# Patient Record
Sex: Female | Born: 1970 | Race: White | Hispanic: No | Marital: Married | Smoking: Former smoker
Health system: Southern US, Community
[De-identification: ages and names within clinical notes are randomized; demographics above are authoritative.]

## PROBLEM LIST (undated history)

## (undated) ENCOUNTER — Ambulatory Visit: Admission: EM | Payer: BC Managed Care – PPO

## (undated) DIAGNOSIS — E079 Disorder of thyroid, unspecified: Secondary | ICD-10-CM

## (undated) HISTORY — DX: Disorder of thyroid, unspecified: E07.9

## (undated) HISTORY — PX: TUBAL LIGATION: SHX77

---

## 2004-09-28 ENCOUNTER — Ambulatory Visit: Payer: Self-pay | Admitting: Obstetrics and Gynecology

## 2011-01-06 ENCOUNTER — Emergency Department: Payer: Self-pay | Admitting: Emergency Medicine

## 2011-11-05 ENCOUNTER — Emergency Department: Payer: Self-pay | Admitting: Emergency Medicine

## 2011-11-05 LAB — CBC
HCT: 39.2 % (ref 35.0–47.0)
HGB: 13.8 g/dL (ref 12.0–16.0)
MCH: 35.3 pg — ABNORMAL HIGH (ref 26.0–34.0)
MCV: 100 fL (ref 80–100)
Platelet: 252 10*3/uL (ref 150–440)
RBC: 3.9 10*6/uL (ref 3.80–5.20)
RDW: 12.2 % (ref 11.5–14.5)

## 2011-11-05 LAB — BASIC METABOLIC PANEL
Anion Gap: 8 (ref 7–16)
BUN: 10 mg/dL (ref 7–18)
Chloride: 106 mmol/L (ref 98–107)
Co2: 25 mmol/L (ref 21–32)
Creatinine: 0.84 mg/dL (ref 0.60–1.30)
Glucose: 74 mg/dL (ref 65–99)
Potassium: 3.5 mmol/L (ref 3.5–5.1)
Sodium: 139 mmol/L (ref 136–145)

## 2011-11-06 ENCOUNTER — Emergency Department: Payer: Self-pay | Admitting: *Deleted

## 2011-11-07 ENCOUNTER — Ambulatory Visit: Payer: Self-pay | Admitting: *Deleted

## 2012-06-07 ENCOUNTER — Emergency Department: Payer: Self-pay | Admitting: Internal Medicine

## 2012-10-24 ENCOUNTER — Emergency Department: Payer: Self-pay | Admitting: Emergency Medicine

## 2013-12-23 ENCOUNTER — Emergency Department: Payer: Self-pay | Admitting: Emergency Medicine

## 2014-06-16 LAB — HM PAP SMEAR: HM Pap smear: NEGATIVE

## 2014-08-03 HISTORY — PX: ROTATOR CUFF REPAIR: SHX139

## 2014-10-20 ENCOUNTER — Ambulatory Visit: Payer: Self-pay | Admitting: Family Medicine

## 2014-12-29 ENCOUNTER — Other Ambulatory Visit: Payer: Self-pay | Admitting: Family Medicine

## 2014-12-29 NOTE — Telephone Encounter (Signed)
Oct 11, 2014 note reviewed I do not see that she ever returned for her follow-up labs She did not keep last visit with Dr. Laural Benes I will allow just enough medicine to last until Dr. Laural Benes returns and she can decide next step (ordering labs or asking patient to be seen for appt)

## 2015-01-02 NOTE — Telephone Encounter (Signed)
Patient needs to come in for lab work

## 2015-02-07 ENCOUNTER — Ambulatory Visit (INDEPENDENT_AMBULATORY_CARE_PROVIDER_SITE_OTHER): Payer: BLUE CROSS/BLUE SHIELD | Admitting: Family Medicine

## 2015-02-07 ENCOUNTER — Encounter: Payer: Self-pay | Admitting: Family Medicine

## 2015-02-07 VITALS — BP 105/69 | HR 60 | Temp 97.9°F | Ht 61.4 in | Wt 121.0 lb

## 2015-02-07 DIAGNOSIS — E039 Hypothyroidism, unspecified: Secondary | ICD-10-CM | POA: Diagnosis not present

## 2015-02-07 MED ORDER — LEVOTHYROXINE SODIUM 100 MCG PO TABS: 100.0000 ug | ORAL_TABLET | Freq: Every day | ORAL | Status: DC

## 2015-02-07 NOTE — Assessment & Plan Note (Signed)
Likely to be very out of control as has not been taking medicine for a month. Will refill and will recheck with lab visit in 6-8 weeks and adjust as needed. Strongly encouraged patient to take her medicine.

## 2015-02-07 NOTE — Progress Notes (Signed)
BP 105/69 mmHg  Pulse 60  Temp(Src) 97.9 F (36.6 C)  Ht 5' 1.4" (1.56 m)  Wt 121 lb (54.885 kg)  BMI 22.55 kg/m2  SpO2 98%  LMP 09/13/2014 (Exact Date)   Subjective:    Patient ID: Vicki Schultz, female    DOB: 1970-07-15, 44 y.o.   MRN: [id number]  HPI: Vicki Schultz is a 44 y.o. female  Chief Complaint  Patient presents with  . Hypothyroidism   HYPOTHYROIDISM- has not been taking her medicine for about the past month Thyroid control status:uncontrolled Satisfied with current treatment? no Medication side effects: yes Medication compliance: poor compliance Recent dose adjustment:no Fatigue: yes Cold intolerance: no Heat intolerance: no Weight gain: no Weight loss: no Constipation: no Diarrhea/loose stools: yes Palpitations: no Lower extremity edema: no Anxiety/depressed mood: no  Relevant past medical, surgical, family and social history reviewed and updated as indicated. Interim medical history since our last visit reviewed. Allergies and medications reviewed and updated.  Review of Systems  Constitutional: Negative.   Respiratory: Negative.   Cardiovascular: Negative.   Psychiatric/Behavioral: Negative.     Per HPI unless specifically indicated above     Objective:    BP 105/69 mmHg  Pulse 60  Temp(Src) 97.9 F (36.6 C)  Ht 5' 1.4" (1.56 m)  Wt 121 lb (54.885 kg)  BMI 22.55 kg/m2  SpO2 98%  LMP 09/13/2014 (Exact Date)  Wt Readings from Last 3 Encounters:  02/07/15 121 lb (54.885 kg)  06/16/14 120 lb (54.432 kg)    Physical Exam  Constitutional: She is oriented to person, place, and time. She appears well-developed and well-nourished. No distress.  HENT:  Head: Normocephalic and atraumatic.  Right Ear: Hearing normal.  Left Ear: Hearing normal.  Nose: Nose normal.  Eyes: Conjunctivae, EOM and lids are normal. Pupils are equal, round, and reactive to light. Right eye exhibits no discharge. Left eye exhibits no discharge. No  scleral icterus.  Cardiovascular: Normal rate, regular rhythm, normal heart sounds and intact distal pulses.  Exam reveals no gallop and no friction rub.   No murmur heard. Pulmonary/Chest: Effort normal and breath sounds normal. No respiratory distress. She has no wheezes. She has no rales. She exhibits no tenderness.  Musculoskeletal: Normal range of motion.  Neurological: She is alert and oriented to person, place, and time.  Skin: Skin is warm, dry and intact. No rash noted. She is not diaphoretic. No erythema. No pallor.  Psychiatric: She has a normal mood and affect. Her speech is normal and behavior is normal. Judgment and thought content normal. Cognition and memory are normal.  Nursing note and vitals reviewed.   Results for orders placed or performed in visit on 11/05/11  CBC  Result Value Ref Range   WBC 10.6 3.6-11.0 x10 3/mm 3   RBC 3.90 3.80-5.20 X10 6/mm 3   HGB 13.8 12.0-16.0 g/dL   HCT 39.2 35.0-47.0 %   MCV 100 80-100 fL   MCH 35.3 (H) 26.0-34.0 pg   MCHC 35.1 32.0-36.0 g/dL   RDW 12.2 11.5-14.5 %   Platelet 252 150-440 x10 3/mm 3  Basic metabolic panel  Result Value Ref Range   Glucose 74 65-99 mg/dL   BUN 10 7-18 mg/dL   Creatinine 0.84 0.60-1.30 mg/dL   Sodium 139 136-145 mmol/L   Potassium 3.5 3.5-5.1 mmol/L   Chloride 106 98-107 mmol/L   Co2 25 21-32 mmol/L   Calcium, Total 8.3 (L) 8.5-10.1 mg/dL   Osmolality 275 275-301   Anion Gap  8 7-16   EGFR (African American) >60    EGFR (Non-African Amer.) >60   Troponin I  Result Value Ref Range   Troponin-I 0.03 ng/mL  CK total and CKMB (cardiac)  Result Value Ref Range   CK, Total 69 21-215 Unit/L   CK-MB 1.1 0.5-3.6 ng/mL      Assessment & Plan:   Problem List Items Addressed This Visit      Endocrine   Hypothyroidism - Primary    Likely to be very out of control as has not been taking medicine for a month. Will refill 100mcg and will recheck with lab visit in 6-8 weeks and adjust as needed.  Strongly encouraged patient to take her medicine.       Relevant Medications   levothyroxine (SYNTHROID, LEVOTHROID) 100 MCG tablet   Other Relevant Orders   TSH   T3   T4       Follow up plan: Return 6-8 weeks for lab visit.       

## 2015-02-08 ENCOUNTER — Encounter: Payer: Self-pay | Admitting: Family Medicine

## 2015-02-08 LAB — T3: T3, Total: 92 ng/dL (ref 71–180)

## 2015-02-08 LAB — TSH: TSH: 104.9 u[IU]/mL — AB (ref 0.450–4.500)

## 2015-02-08 LAB — T4: T4, Total: 4.1 ug/dL — ABNORMAL LOW (ref 4.5–12.0)

## 2015-03-21 ENCOUNTER — Other Ambulatory Visit: Payer: BLUE CROSS/BLUE SHIELD

## 2015-03-21 ENCOUNTER — Other Ambulatory Visit: Payer: Self-pay | Admitting: Family Medicine

## 2015-03-21 DIAGNOSIS — E039 Hypothyroidism, unspecified: Secondary | ICD-10-CM

## 2015-03-24 ENCOUNTER — Other Ambulatory Visit: Payer: BLUE CROSS/BLUE SHIELD

## 2015-03-24 DIAGNOSIS — E039 Hypothyroidism, unspecified: Secondary | ICD-10-CM

## 2015-03-25 LAB — TSH: TSH: 2.04 u[IU]/mL (ref 0.450–4.500)

## 2015-03-28 ENCOUNTER — Telehealth: Payer: Self-pay | Admitting: Family Medicine

## 2015-03-28 MED ORDER — LEVOTHYROXINE SODIUM 100 MCG PO TABS: 100.0000 ug | ORAL_TABLET | Freq: Every day | ORAL | Status: DC

## 2015-03-28 NOTE — Telephone Encounter (Signed)
Please let her know that her thyroid came back normal. I've sent through a years supply to her pharmacy.

## 2015-03-28 NOTE — Telephone Encounter (Signed)
Patient notified

## 2015-04-21 ENCOUNTER — Telehealth: Payer: Self-pay | Admitting: Family Medicine

## 2015-04-21 NOTE — Telephone Encounter (Signed)
Pt thinks she has a staff infection and would like to have antibiotic sent to [facility] [place]

## 2015-04-24 ENCOUNTER — Ambulatory Visit: Payer: BLUE CROSS/BLUE SHIELD | Admitting: Family Medicine

## 2015-04-24 NOTE — Telephone Encounter (Signed)
Patient is scheduled to be seen today 04/24/15.

## 2016-03-27 ENCOUNTER — Other Ambulatory Visit: Payer: Self-pay | Admitting: Family Medicine

## 2016-04-25 ENCOUNTER — Ambulatory Visit: Payer: Self-pay | Admitting: Family Medicine

## 2016-04-26 ENCOUNTER — Ambulatory Visit (INDEPENDENT_AMBULATORY_CARE_PROVIDER_SITE_OTHER): Payer: Managed Care, Other (non HMO) | Admitting: Family Medicine

## 2016-04-26 ENCOUNTER — Encounter: Payer: Self-pay | Admitting: Family Medicine

## 2016-04-26 VITALS — BP 100/65 | HR 68 | Temp 98.1°F | Ht 61.0 in | Wt 133.2 lb

## 2016-04-26 DIAGNOSIS — L0100 Impetigo, unspecified: Secondary | ICD-10-CM | POA: Diagnosis not present

## 2016-04-26 NOTE — Patient Instructions (Signed)
Apply neosporin several times daily to affected area

## 2016-04-26 NOTE — Progress Notes (Signed)
   BP 100/65 (BP Location: Left Arm, Patient Position: Sitting, Cuff Size: Normal)   Pulse 68   Temp 98.1 F (36.7 C)   Ht 5\' 1"  (1.549 m)   Wt 133 lb 3.2 oz (60.4 kg)   SpO2 100%   BMI 25.17 kg/m    Subjective:    Patient ID: Vicki Schultz, female    DOB: August 03, 1970, 45 y.o.   MRN: [id number]  HPI: Vicki Schultz is a 45 y.o. female  Chief Complaint  Patient presents with  . Rash    Under Nose   Patient presents with a raw, crusted area under her nose that she states occurs every year in the winter time. States she gets drainage every fall that creates a raw area under her nose and then it becomes this infection. Has not been using anything on the area. Denies fever, chills, sweats, cough, ear pain.    Relevant past medical, surgical, family and social history reviewed and updated as indicated. Interim medical history since our last visit reviewed. Allergies and medications reviewed and updated.  Review of Systems  Constitutional: Negative.   HENT: Positive for rhinorrhea.   Eyes: Negative.   Respiratory: Negative.   Cardiovascular: Negative.   Gastrointestinal: Negative.   Genitourinary: Negative.   Musculoskeletal: Negative.   Skin:       Raw area under nose  Neurological: Negative.   Psychiatric/Behavioral: Negative.     Per HPI unless specifically indicated above     Objective:    BP 100/65 (BP Location: Left Arm, Patient Position: Sitting, Cuff Size: Normal)   Pulse 68   Temp 98.1 F (36.7 C)   Ht 5\' 1"  (1.549 m)   Wt 133 lb 3.2 oz (60.4 kg)   SpO2 100%   BMI 25.17 kg/m   Wt Readings from Last 3 Encounters:  04/26/16 133 lb 3.2 oz (60.4 kg)  02/07/15 121 lb (54.9 kg)  06/16/14 120 lb (54.4 kg)    Physical Exam  Constitutional: She is oriented to person, place, and time. She appears well-developed and well-nourished.  HENT:  Head: Atraumatic.  Right Ear: External ear normal.  Left Ear: External ear normal.  Mouth/Throat: Oropharynx is  clear and moist.  Raw, golden crusted area under b/l nares  Eyes: Conjunctivae are normal. Pupils are equal, round, and reactive to light.  Neck: Normal range of motion. Neck supple.  Cardiovascular: Normal rate, regular rhythm and normal heart sounds.   Pulmonary/Chest: Effort normal and breath sounds normal.  Musculoskeletal: Normal range of motion.  Neurological: She is alert and oriented to person, place, and time.  Skin: Skin is warm and dry.  Psychiatric: She has a normal mood and affect. Her behavior is normal.  Nursing note and vitals reviewed.     Assessment & Plan:   Problem List Items Addressed This Visit    None    Visit Diagnoses    Impetigo    -  Primary   Apply neosporin ointment 1-3 times daily until area is healed. Follow up if worsening or no improvement   Relevant Medications   valACYclovir (VALTREX) 1000 MG tablet       Follow up plan: Return for as scheduled for physical exam.

## 2016-05-01 ENCOUNTER — Encounter: Payer: Self-pay | Admitting: Family Medicine

## 2016-05-01 ENCOUNTER — Ambulatory Visit (INDEPENDENT_AMBULATORY_CARE_PROVIDER_SITE_OTHER): Payer: Managed Care, Other (non HMO) | Admitting: Family Medicine

## 2016-05-01 VITALS — BP 121/79 | HR 69 | Temp 98.2°F | Ht 62.2 in | Wt 132.2 lb

## 2016-05-01 DIAGNOSIS — Z0001 Encounter for general adult medical examination with abnormal findings: Secondary | ICD-10-CM

## 2016-05-01 DIAGNOSIS — M545 Low back pain, unspecified: Secondary | ICD-10-CM

## 2016-05-01 DIAGNOSIS — G8929 Other chronic pain: Secondary | ICD-10-CM | POA: Diagnosis not present

## 2016-05-01 DIAGNOSIS — Z1231 Encounter for screening mammogram for malignant neoplasm of breast: Secondary | ICD-10-CM

## 2016-05-01 DIAGNOSIS — Z131 Encounter for screening for diabetes mellitus: Secondary | ICD-10-CM

## 2016-05-01 DIAGNOSIS — Z23 Encounter for immunization: Secondary | ICD-10-CM | POA: Diagnosis not present

## 2016-05-01 DIAGNOSIS — Z13220 Encounter for screening for lipoid disorders: Secondary | ICD-10-CM

## 2016-05-01 DIAGNOSIS — E039 Hypothyroidism, unspecified: Secondary | ICD-10-CM | POA: Diagnosis not present

## 2016-05-01 DIAGNOSIS — Z1239 Encounter for other screening for malignant neoplasm of breast: Secondary | ICD-10-CM

## 2016-05-01 DIAGNOSIS — Z0000 Encounter for general adult medical examination without abnormal findings: Secondary | ICD-10-CM

## 2016-05-01 MED ORDER — ACYCLOVIR 5 % EX CREA: 1.0000 "application " | TOPICAL_CREAM | CUTANEOUS | 1 refills | Status: DC

## 2016-05-01 MED ORDER — VALACYCLOVIR HCL 1 G PO TABS: 1000.0000 mg | ORAL_TABLET | Freq: Every day | ORAL | 2 refills | Status: DC | PRN

## 2016-05-01 NOTE — Patient Instructions (Addendum)
Tdap Vaccine (Tetanus, Diphtheria and Pertussis): What You Need to Know 1. Why get vaccinated? Tetanus, diphtheria and pertussis are very serious diseases. Tdap vaccine can protect us from these diseases. And, Tdap vaccine given to pregnant women can protect newborn babies against pertussis. TETANUS (Lockjaw) is rare in the [place] today. It causes painful muscle tightening and stiffness, usually all over the body.  It can lead to tightening of muscles in the head and neck so you can't open your mouth, swallow, or sometimes even breathe. Tetanus kills about 1 out of 10 people who are infected even after receiving the best medical care.  DIPHTHERIA is also rare in the [place] today. It can cause a thick coating to form in the back of the throat.  It can lead to breathing problems, heart failure, paralysis, and death.  PERTUSSIS (Whooping Cough) causes severe coughing spells, which can cause difficulty breathing, vomiting and disturbed sleep.  It can also lead to weight loss, incontinence, and rib fractures. Up to 2 in 100 adolescents and 5 in 100 adults with pertussis are hospitalized or have complications, which could include pneumonia or death.  These diseases are caused by bacteria. Diphtheria and pertussis are spread from person to person through secretions from coughing or sneezing. Tetanus enters the body through cuts, scratches, or wounds. Before vaccines, as many as 200,000 cases of diphtheria, 200,000 cases of pertussis, and hundreds of cases of tetanus, were reported in the [place] each year. Since vaccination began, reports of cases for tetanus and diphtheria have dropped by about 99% and for pertussis by about 80%. 2. Tdap vaccine Tdap vaccine can protect adolescents and adults from tetanus, diphtheria, and pertussis. One dose of Tdap is routinely given at age 11 or 12. People who did not get Tdap at that age should get it as soon as possible. Tdap is especially  important for healthcare professionals and anyone having close contact with a baby younger than 12 months. Pregnant women should get a dose of Tdap during every pregnancy, to protect the newborn from pertussis. Infants are most at risk for severe, life-threatening complications from pertussis. Another vaccine, called Td, protects against tetanus and diphtheria, but not pertussis. A Td booster should be given every 10 years. Tdap may be given as one of these boosters if you have never gotten Tdap before. Tdap may also be given after a severe cut or burn to prevent tetanus infection. Your doctor or the person giving you the vaccine can give you more information. Tdap may safely be given at the same time as other vaccines. 3. Some people should not get this vaccine  A person who has ever had a life-threatening allergic reaction after a previous dose of any diphtheria, tetanus or pertussis containing vaccine, OR has a severe allergy to any part of this vaccine, should not get Tdap vaccine. Tell the person giving the vaccine about any severe allergies.  Anyone who had coma or long repeated seizures within 7 days after a childhood dose of DTP or DTaP, or a previous dose of Tdap, should not get Tdap, unless a cause other than the vaccine was found. They can still get Td.  Talk to your doctor if you: ? have seizures or another nervous system problem, ? had severe pain or swelling after any vaccine containing diphtheria, tetanus or pertussis, ? ever had a condition called Guillain-Barr Syndrome (GBS), ? aren't feeling well on the day the shot is scheduled. 4. Risks With any medicine, including   vaccines, there is a chance of side effects. These are usually mild and go away on their own. Serious reactions are also possible but are rare. Most people who get Tdap vaccine do not have any problems with it. Mild problems following Tdap: (Did not interfere with activities)  Pain where the shot was given (about  3 in 4 adolescents or 2 in 3 adults)  Redness or swelling where the shot was given (about 1 person in 5)  Mild fever of at least 100.4F (up to about 1 in 25 adolescents or 1 in 100 adults)  Headache (about 3 or 4 people in 10)  Tiredness (about 1 person in 3 or 4)  Nausea, vomiting, diarrhea, stomach ache (up to 1 in 4 adolescents or 1 in 10 adults)  Chills, sore joints (about 1 person in 10)  Body aches (about 1 person in 3 or 4)  Rash, swollen glands (uncommon)  Moderate problems following Tdap: (Interfered with activities, but did not require medical attention)  Pain where the shot was given (up to 1 in 5 or 6)  Redness or swelling where the shot was given (up to about 1 in 16 adolescents or 1 in 12 adults)  Fever over 102F (about 1 in 100 adolescents or 1 in 250 adults)  Headache (about 1 in 7 adolescents or 1 in 10 adults)  Nausea, vomiting, diarrhea, stomach ache (up to 1 or 3 people in 100)  Swelling of the entire arm where the shot was given (up to about 1 in 500).  Severe problems following Tdap: (Unable to perform usual activities; required medical attention)  Swelling, severe pain, bleeding and redness in the arm where the shot was given (rare).  Problems that could happen after any vaccine:  People sometimes faint after a medical procedure, including vaccination. Sitting or lying down for about 15 minutes can help prevent fainting, and injuries caused by a fall. Tell your doctor if you feel dizzy, or have vision changes or ringing in the ears.  Some people get severe pain in the shoulder and have difficulty moving the arm where a shot was given. This happens very rarely.  Any medication can cause a severe allergic reaction. Such reactions from a vaccine are very rare, estimated at fewer than 1 in a million doses, and would happen within a few minutes to a few hours after the vaccination. As with any medicine, there is a very remote chance of a vaccine  causing a serious injury or death. The safety of vaccines is always being monitored. For more information, visit: www.cdc.gov/vaccinesafety/ 5. What if there is a serious problem? What should I look for? Look for anything that concerns you, such as signs of a severe allergic reaction, very high fever, or unusual behavior. Signs of a severe allergic reaction can include hives, swelling of the face and throat, difficulty breathing, a fast heartbeat, dizziness, and weakness. These would usually start a few minutes to a few hours after the vaccination. What should I do?  If you think it is a severe allergic reaction or other emergency that can't wait, call 9-1-1 or get the person to the nearest hospital. Otherwise, call your doctor.  Afterward, the reaction should be reported to the Vaccine Adverse Event Reporting System (VAERS). Your doctor might file this report, or you can do it yourself through the VAERS web site at www.vaers.hhs.gov, or by calling 1-800-822-7967. ? VAERS does not give medical advice. 6. The National Vaccine Injury Compensation Program The National   Vaccine Injury Compensation Program (VICP) is a federal program that was created to compensate people who may have been injured by certain vaccines. Persons who believe they may have been injured by a vaccine can learn about the program and about filing a claim by calling 1-800-338-2382 or visiting the VICP website at www.hrsa.gov/vaccinecompensation. There is a time limit to file a claim for compensation. 7. How can I learn more?  Ask your doctor. He or she can give you the vaccine package insert or suggest other sources of information.  Call your local or state health department.  Contact the Centers for Disease Control and Prevention (CDC): ? Call 1-800-232-4636 (1-800-CDC-INFO) or ? Visit CDC's website at www.cdc.gov/vaccines CDC Tdap Vaccine VIS (07/06/13) This information is not intended to replace advice given to you by your  health care provider. Make sure you discuss any questions you have with your health care provider. Document Released: 10/29/2011 Document Revised: 01/18/2016 Document Reviewed: 01/18/2016 Elsevier Interactive Patient Education  2017 [employer]  

## 2016-05-01 NOTE — Assessment & Plan Note (Signed)
Await results, will refill medication dependent on TSH

## 2016-05-01 NOTE — Progress Notes (Signed)
BP 121/79 (BP Location: Right Arm, Patient Position: Sitting, Cuff Size: Small)   Pulse 69   Temp 98.2 F (36.8 C)   Ht 5' 2.2" (1.58 m)   Wt 132 lb 3.2 oz (60 kg)   LMP  (LMP Unknown)   SpO2 100%   BMI 24.02 kg/m    Subjective:    Patient ID: Vicki Schultz, female    DOB: 1971-02-21, 45 y.o.   MRN: [id number]  HPI: Vicki Schultz is a 45 y.o. female presenting on 05/01/2016 for comprehensive medical examination. Current medical complaints include: Low back pain persistent x 1 year or more. States it seems to only happen at night, if she does not take naproxen before bed she will awake in the night with back pain and barely be able to get out of the bed in the morning. Tried getting a new mattress 6 months ago with no relief. Finds that tylenol or naproxen will always provide relief. Uses heat pad sometimes too. Once she is up and moving around the pain ceases. No known previous injury or lifting objects. No radiation down legs, weakness, numbness or tingling. Does have a sister with RA so concerned for that.   Depression Screen done today and results listed below:  Depression screen [facility] 2/9 05/01/2016  Decreased Interest 0  Down, Depressed, Hopeless 0  PHQ - 2 Score 0  Altered sleeping 0  Tired, decreased energy 0  Change in appetite 0  Feeling bad or failure about yourself  0  Trouble concentrating 1  Moving slowly or fidgety/restless 0  Suicidal thoughts 0  PHQ-9 Score 1    The patient does not have a history of falls. I did not complete a risk assessment for falls. A plan of care for falls was not documented.   Past Medical History:  Past Medical History:  Diagnosis Date  . Thyroid disease     Surgical History:  Past Surgical History:  Procedure Laterality Date  . CESAREAN SECTION    . ROTATOR CUFF REPAIR Right 08/03/14  . TUBAL LIGATION      Medications:  Current Outpatient Prescriptions on File Prior to Visit  Medication Sig  . levothyroxine  (SYNTHROID, LEVOTHROID) 100 MCG tablet TAKE 1 TABLET(100 MCG) BY MOUTH DAILY   No current facility-administered medications on file prior to visit.     Allergies:  Allergies  Allergen Reactions  . Codeine     Social History:  Social History   Social History  . Marital status: Single    Spouse name: N/A  . Number of children: N/A  . Years of education: N/A   Occupational History  . Not on file.   Social History Main Topics  . Smoking status: Current Every Day Smoker    Packs/day: 0.50    Types: Cigarettes  . Smokeless tobacco: Never Used  . Alcohol use 0.0 oz/week     Comment: two or less per day  . Drug use: No  . Sexual activity: Yes   Other Topics Concern  . Not on file   Social History Narrative  . No narrative on file   History  Smoking Status  . Current Every Day Smoker  . Packs/day: 0.50  . Types: Cigarettes  Smokeless Tobacco  . Never Used   History  Alcohol Use  . 0.0 oz/week    Comment: two or less per day    Family History:  Family History  Problem Relation Age of Onset  . Cancer Mother   .  Hypertension Mother   . Mental illness Mother   . Thyroid disease Mother   . Diabetes Father   . Arthritis Sister   . Hypertension Sister     Past medical history, surgical history, medications, allergies, family history and social history reviewed with patient today and changes made to appropriate areas of the chart.   Review of Systems - General ROS: negative Psychological ROS: negative Ophthalmic ROS: negative ENT ROS: negative Breast ROS: negative for breast lumps Respiratory ROS: no cough, shortness of breath, or wheezing Cardiovascular ROS: no chest pain or dyspnea on exertion Gastrointestinal ROS: no abdominal pain, change in bowel habits, or black or bloody stools Genito-Urinary ROS: no dysuria, trouble voiding, or hematuria Musculoskeletal ROS: positive for - joint pain Neurological ROS: no TIA or stroke symptoms Dermatological ROS:  negative All other ROS negative except what is listed above and in the HPI.      Objective:    BP 121/79 (BP Location: Right Arm, Patient Position: Sitting, Cuff Size: Small)   Pulse 69   Temp 98.2 F (36.8 C)   Ht 5' 2.2" (1.58 m)   Wt 132 lb 3.2 oz (60 kg)   LMP  (LMP Unknown)   SpO2 100%   BMI 24.02 kg/m   Wt Readings from Last 3 Encounters:  05/01/16 132 lb 3.2 oz (60 kg)  04/26/16 133 lb 3.2 oz (60.4 kg)  02/07/15 121 lb (54.9 kg)    Physical Exam  Constitutional: She is oriented to person, place, and time. She appears well-developed and well-nourished. No distress.  HENT:  Head: Atraumatic.  Right Ear: External ear normal.  Left Ear: External ear normal.  Nose: Nose normal.  Mouth/Throat: Oropharynx is clear and moist. No oropharyngeal exudate.  Eyes: Pupils are equal, round, and reactive to light. No scleral icterus.  Neck: Normal range of motion. Neck supple. No thyromegaly present.  Cardiovascular: Normal rate, regular rhythm and normal heart sounds.   Pulmonary/Chest: Effort normal and breath sounds normal. No respiratory distress.  Abdominal: Soft. Bowel sounds are normal. She exhibits no distension.  Small, painless chronic umbilical hernia present, reducible  Musculoskeletal: Normal range of motion. She exhibits no edema or tenderness.  Lymphadenopathy:    She has no cervical adenopathy.  Neurological: She is alert and oriented to person, place, and time. No cranial nerve deficit. Coordination normal.  Skin: Skin is warm and dry.  Psychiatric: She has a normal mood and affect. Her behavior is normal.  Nursing note and vitals reviewed.   Results for orders placed or performed in visit on 05/01/16  Microscopic Examination  Result Value Ref Range   WBC, UA 6-10 (A) 0 - 5 /hpf   RBC, UA None seen 0 - 2 /hpf   Epithelial Cells (non renal) 0-10 0 - 10 /hpf   Bacteria, UA Many (A) None seen/Few  UA/M w/rflx Culture, Routine  Result Value Ref Range   Specific  Gravity, UA 1.020 1.005 - 1.030   pH, UA 5.5 5.0 - 7.5   Color, UA Yellow Yellow   Appearance Ur Cloudy (A) Clear   Leukocytes, UA 1+ (A) Negative   Protein, UA Negative Negative/Trace   Glucose, UA Negative Negative   Ketones, UA Negative Negative   RBC, UA Negative Negative   Bilirubin, UA Negative Negative   Urobilinogen, Ur 0.2 0.2 - 1.0 mg/dL   Nitrite, UA Positive (A) Negative   Microscopic Examination See below:    Urinalysis Reflex Comment   Urine Culture, Routine  Result Value Ref Range   Urine Culture, Routine WILL FOLLOW       Assessment & Plan:   Problem List Items Addressed This Visit      Endocrine   Hypothyroidism    Await results, will refill medication dependent on TSH      Relevant Orders   CBC with Differential/Platelet   TSH    Other Visit Diagnoses    Annual physical exam    -  Primary   Await lab results. Mammogram ordered.    Need for diphtheria-tetanus-pertussis (Tdap) vaccine, adult/adolescent       Relevant Orders   Tdap vaccine greater than or equal to 7yo IM (Completed)   Chronic bilateral low back pain without sciatica       Suspect arthritic, will get ESR and RF as she has family hx of RA. Continue tylenol and naproxen prn for pain relief.    Relevant Orders   DG Lumbar Spine Complete   Sed Rate (ESR)   Rheumatoid Factor   Screening for breast cancer       Relevant Orders   MM DIGITAL SCREENING BILATERAL   Screening for diabetes mellitus       Relevant Orders   Comprehensive metabolic panel   UA/M w/rflx Culture, Routine (Completed)   HgB A1c   Screening for hyperlipidemia       Relevant Orders   Lipid Panel w/o Chol/HDL Ratio       Follow up plan: Return in about 1 year (around 05/01/2017) for Annual Exam, TSH.   LABORATORY TESTING:  - Pap smear: up to date  IMMUNIZATIONS:   - Tdap: Tetanus vaccination status reviewed: Td vaccination indicated and given today. - Influenza: Up to date  SCREENING: -Mammogram: Ordered  today   PATIENT COUNSELING:   Advised to take 1 mg of folate supplement per day if capable of pregnancy.   Sexuality: Discussed sexually transmitted diseases, partner selection, use of condoms, avoidance of unintended pregnancy  and contraceptive alternatives.   Advised to avoid cigarette smoking.  I discussed with the patient that most people either abstain from alcohol or drink within safe limits (<=14/week and <=4 drinks/occasion for males, <=7/weeks and <= 3 drinks/occasion for females) and that the risk for alcohol disorders and other health effects rises proportionally with the number of drinks per week and how often a drinker exceeds daily limits.  Discussed cessation/primary prevention of drug use and availability of treatment for abuse.   Diet: Encouraged to adjust caloric intake to maintain  or achieve ideal body weight, to reduce intake of dietary saturated fat and total fat, to limit sodium intake by avoiding high sodium foods and not adding table salt, and to maintain adequate dietary potassium and calcium preferably from fresh fruits, vegetables, and low-fat dairy products.    stressed the importance of regular exercise  Injury prevention: Discussed safety belts, safety helmets, smoke detector, smoking near bedding or upholstery.   Dental health: Discussed importance of regular tooth brushing, flossing, and dental visits.    NEXT PREVENTATIVE PHYSICAL DUE IN 1 YEAR. Return in about 1 year (around 05/01/2017) for Annual Exam, TSH.

## 2016-05-02 ENCOUNTER — Telehealth: Payer: Self-pay | Admitting: Family Medicine

## 2016-05-02 LAB — CBC WITH DIFFERENTIAL/PLATELET
BASOS: 1 %
Basophils Absolute: 0.1 10*3/uL (ref 0.0–0.2)
EOS (ABSOLUTE): 0.6 10*3/uL — AB (ref 0.0–0.4)
Eos: 6 %
Hematocrit: 41 % (ref 34.0–46.6)
Hemoglobin: 13.9 g/dL (ref 11.1–15.9)
Immature Grans (Abs): 0 10*3/uL (ref 0.0–0.1)
Immature Granulocytes: 0 %
Lymphocytes Absolute: 4.4 10*3/uL — ABNORMAL HIGH (ref 0.7–3.1)
Lymphs: 46 %
MCH: 32.3 pg (ref 26.6–33.0)
MCHC: 33.9 g/dL (ref 31.5–35.7)
MCV: 95 fL (ref 79–97)
MONOS ABS: 0.5 10*3/uL (ref 0.1–0.9)
Monocytes: 6 %
NEUTROS ABS: 3.9 10*3/uL (ref 1.4–7.0)
NEUTROS PCT: 41 %
PLATELETS: 315 10*3/uL (ref 150–379)
RBC: 4.31 x10E6/uL (ref 3.77–5.28)
RDW: 13 % (ref 12.3–15.4)
WBC: 9.5 10*3/uL (ref 3.4–10.8)

## 2016-05-02 LAB — COMPREHENSIVE METABOLIC PANEL
ALK PHOS: 92 IU/L (ref 39–117)
ALT: 21 IU/L (ref 0–32)
AST: 15 IU/L (ref 0–40)
Albumin/Globulin Ratio: 1.8 (ref 1.2–2.2)
Albumin: 4.2 g/dL (ref 3.5–5.5)
BUN / CREAT RATIO: 17 (ref 9–23)
BUN: 12 mg/dL (ref 6–24)
Bilirubin Total: <0.2 mg/dL (ref 0.0–1.2)
CALCIUM: 9.3 mg/dL (ref 8.7–10.2)
CO2: 23 mmol/L (ref 18–29)
CREATININE: 0.69 mg/dL (ref 0.57–1.00)
Chloride: 100 mmol/L (ref 96–106)
GFR calc Af Amer: 120 mL/min/{1.73_m2} (ref >59)
GFR, EST NON AFRICAN AMERICAN: 104 mL/min/{1.73_m2} (ref >59)
GLOBULIN, TOTAL: 2.4 g/dL (ref 1.5–4.5)
Glucose: 90 mg/dL (ref 65–99)
Potassium: 4.2 mmol/L (ref 3.5–5.2)
SODIUM: 141 mmol/L (ref 134–144)
TOTAL PROTEIN: 6.6 g/dL (ref 6.0–8.5)

## 2016-05-02 LAB — RHEUMATOID FACTOR: Rhuematoid fact SerPl-aCnc: <10 IU/mL (ref 0.0–13.9)

## 2016-05-02 LAB — LIPID PANEL W/O CHOL/HDL RATIO
CHOLESTEROL TOTAL: 232 mg/dL — AB (ref 100–199)
HDL: 32 mg/dL — ABNORMAL LOW (ref >39)
Triglycerides: 475 mg/dL — ABNORMAL HIGH (ref 0–149)

## 2016-05-02 LAB — HEMOGLOBIN A1C
ESTIMATED AVERAGE GLUCOSE: 108 mg/dL
Hgb A1c MFr Bld: 5.4 % (ref 4.8–5.6)

## 2016-05-02 LAB — SEDIMENTATION RATE: Sed Rate: 2 mm/hr (ref 0–32)

## 2016-05-02 LAB — TSH: TSH: 0.305 u[IU]/mL — AB (ref 0.450–4.500)

## 2016-05-02 NOTE — Telephone Encounter (Signed)
Called pt, left message to return call to discuss lab results

## 2016-05-03 MED ORDER — SULFAMETHOXAZOLE-TRIMETHOPRIM 800-160 MG PO TABS: 1.0000 | ORAL_TABLET | Freq: Two times a day (BID) | ORAL | 0 refills | Status: DC

## 2016-05-03 NOTE — Telephone Encounter (Signed)
Called and left patient a VM asking for her to please return my call to discuss lab results.  

## 2016-05-03 NOTE — Telephone Encounter (Signed)
Please try calling patient again regarding lab results.   - Her urine showed a UTI that we do need to treat, so I will be sending over an antibiotic for her to start taking and we will let her know when the urine culture report comes back.   - Her thyroid came back just slightly hyperactive, so I would like to run a more thorough test in the next week or so to see what is going on with that.   - Her cholesterol came back too high to read, so I would like her to come back FASTING for repeat lipid (will put both the thyroid study and lipid in so she can just come for labs)  - all other labs were completely normal. 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   Thanks

## 2016-05-05 LAB — URINE CULTURE, REFLEX

## 2016-05-05 LAB — UA/M W/RFLX CULTURE, ROUTINE
BILIRUBIN UA: NEGATIVE
GLUCOSE, UA: NEGATIVE
KETONES UA: NEGATIVE
NITRITE UA: POSITIVE — AB
Protein, UA: NEGATIVE
RBC, UA: NEGATIVE
SPEC GRAV UA: 1.02 (ref 1.005–1.030)
Urobilinogen, Ur: 0.2 mg/dL (ref 0.2–1.0)
pH, UA: 5.5 (ref 5.0–7.5)

## 2016-05-05 LAB — MICROSCOPIC EXAMINATION: RBC MICROSCOPIC, UA: NONE SEEN /HPF (ref 0–?)

## 2016-05-07 NOTE — Telephone Encounter (Signed)
Left message to call.

## 2016-05-08 NOTE — Telephone Encounter (Signed)
Left message to call.

## 2016-05-09 ENCOUNTER — Ambulatory Visit (INDEPENDENT_AMBULATORY_CARE_PROVIDER_SITE_OTHER): Payer: Managed Care, Other (non HMO) | Admitting: Family Medicine

## 2016-05-09 ENCOUNTER — Encounter: Payer: Self-pay | Admitting: Family Medicine

## 2016-05-09 VITALS — BP 116/83 | HR 64 | Temp 98.2°F | Wt 131.6 lb

## 2016-05-09 DIAGNOSIS — N3000 Acute cystitis without hematuria: Secondary | ICD-10-CM | POA: Diagnosis not present

## 2016-05-09 DIAGNOSIS — Z113 Encounter for screening for infections with a predominantly sexual mode of transmission: Secondary | ICD-10-CM | POA: Diagnosis not present

## 2016-05-09 DIAGNOSIS — E782 Mixed hyperlipidemia: Secondary | ICD-10-CM

## 2016-05-09 DIAGNOSIS — E039 Hypothyroidism, unspecified: Secondary | ICD-10-CM

## 2016-05-09 LAB — UA/M W/RFLX CULTURE, ROUTINE
Bilirubin, UA: NEGATIVE
Glucose, UA: NEGATIVE
Ketones, UA: NEGATIVE
LEUKOCYTES UA: NEGATIVE
NITRITE UA: NEGATIVE
PH UA: 5.5 (ref 5.0–7.5)
Protein, UA: NEGATIVE
RBC, UA: NEGATIVE
Specific Gravity, UA: 1.02 (ref 1.005–1.030)
Urobilinogen, Ur: 0.2 mg/dL (ref 0.2–1.0)

## 2016-05-09 MED ORDER — LEVOTHYROXINE SODIUM 88 MCG PO TABS: ORAL_TABLET | ORAL | 1 refills | Status: DC

## 2016-05-09 NOTE — Progress Notes (Signed)
BP 116/83 (BP Location: Left Arm, Patient Position: Sitting, Cuff Size: Normal)   Pulse 64   Temp 98.2 F (36.8 C)   Wt 131 lb 9.6 oz (59.7 kg)   LMP  (LMP Unknown)   SpO2 99%   BMI 23.92 kg/m    Subjective:    Patient ID: Vicki Schultz, female    DOB: [birth year], 45 y.o.   MRN: [id number]  HPI: Vicki Schultz is a 45 y.o. female  Chief Complaint  Patient presents with  . Exposure to STD    Patient was possible exposed on 05/02/16, she was seen earlier this week and given a antibiotic for a UTI, the burning stopped while on the medication and now has restarted, so now she is concerned that it may be a STD   STD SCREENING Sexual activity:  Recent unprotected sexual encounter Contraception: no Recent unprotected intercourse: yes History of sexually transmitted diseases: no Previous sexually transmitted disease screening: yes Genital lesions: no Vaginal discharge: no Dysuria: yes Swollen lymph nodes: no Fevers: no Rash: no  Relevant past medical, surgical, family and social history reviewed and updated as indicated. Interim medical history since our last visit reviewed. Allergies and medications reviewed and updated.  Review of Systems  Constitutional: Negative.   Respiratory: Negative.   Cardiovascular: Negative.   Psychiatric/Behavioral: Negative.     Per HPI unless specifically indicated above     Objective:    BP 116/83 (BP Location: Left Arm, Patient Position: Sitting, Cuff Size: Normal)   Pulse 64   Temp 98.2 F (36.8 C)   Wt 131 lb 9.6 oz (59.7 kg)   LMP  (LMP Unknown)   SpO2 99%   BMI 23.92 kg/m   Wt Readings from Last 3 Encounters:  05/09/16 131 lb 9.6 oz (59.7 kg)  05/01/16 132 lb 3.2 oz (60 kg)  04/26/16 133 lb 3.2 oz (60.4 kg)    Physical Exam  Constitutional: She is oriented to person, place, and time. She appears well-developed and well-nourished. No distress.  HENT:  Head: Normocephalic and atraumatic.  Right Ear: Hearing  normal.  Left Ear: Hearing normal.  Nose: Nose normal.  Eyes: Conjunctivae and lids are normal. Right eye exhibits no discharge. Left eye exhibits no discharge. No scleral icterus.  Cardiovascular: Normal rate, regular rhythm, normal heart sounds and intact distal pulses.  Exam reveals no gallop and no friction rub.   No murmur heard. Pulmonary/Chest: Effort normal and breath sounds normal. No respiratory distress. She has no wheezes. She has no rales. She exhibits no tenderness.  Musculoskeletal: Normal range of motion.  Neurological: She is alert and oriented to person, place, and time.  Skin: Skin is warm, dry and intact. No rash noted. No erythema. No pallor.  Psychiatric: She has a normal mood and affect. Her speech is normal and behavior is normal. Judgment and thought content normal. Cognition and memory are normal.  Nursing note and vitals reviewed.   Results for orders placed or performed in visit on 05/01/16  Microscopic Examination  Result Value Ref Range   WBC, UA 6-10 (A) 0 - 5 /hpf   RBC, UA None seen 0 - 2 /hpf   Epithelial Cells (non renal) 0-10 0 - 10 /hpf   Bacteria, UA Many (A) None seen/Few  CBC with Differential/Platelet  Result Value Ref Range   WBC 9.5 3.4 - 10.8 x10E3/uL   RBC 4.31 3.77 - 5.28 x10E6/uL   Hemoglobin 13.9 11.1 - 15.9 g/dL  Hematocrit 41.0 34.0 - 46.6 %   MCV 95 79 - 97 fL   MCH 32.3 26.6 - 33.0 pg   MCHC 33.9 31.5 - 35.7 g/dL   RDW 13.0 12.3 - 15.4 %   Platelets 315 150 - 379 x10E3/uL   Neutrophils 41 Not Estab. %   Lymphs 46 Not Estab. %   Monocytes 6 Not Estab. %   Eos 6 Not Estab. %   Basos 1 Not Estab. %   Neutrophils Absolute 3.9 1.4 - 7.0 x10E3/uL   Lymphocytes Absolute 4.4 (H) 0.7 - 3.1 x10E3/uL   Monocytes Absolute 0.5 0.1 - 0.9 x10E3/uL   EOS (ABSOLUTE) 0.6 (H) 0.0 - 0.4 x10E3/uL   Basophils Absolute 0.1 0.0 - 0.2 x10E3/uL   Immature Granulocytes 0 Not Estab. %   Immature Grans (Abs) 0.0 0.0 - 0.1 x10E3/uL  Comprehensive  metabolic panel  Result Value Ref Range   Glucose 90 65 - 99 mg/dL   BUN 12 6 - 24 mg/dL   Creatinine, Ser 0.69 0.57 - 1.00 mg/dL   GFR calc non Af Amer 104 >59 mL/min/1.73   GFR calc Af Amer 120 >59 mL/min/1.73   BUN/Creatinine Ratio 17 9 - 23   Sodium 141 134 - 144 mmol/L   Potassium 4.2 3.5 - 5.2 mmol/L   Chloride 100 96 - 106 mmol/L   CO2 23 18 - 29 mmol/L   Calcium 9.3 8.7 - 10.2 mg/dL   Total Protein 6.6 6.0 - 8.5 g/dL   Albumin 4.2 3.5 - 5.5 g/dL   Globulin, Total 2.4 1.5 - 4.5 g/dL   Albumin/Globulin Ratio 1.8 1.2 - 2.2   Bilirubin Total <0.2 0.0 - 1.2 mg/dL   Alkaline Phosphatase 92 39 - 117 IU/L   AST 15 0 - 40 IU/L   ALT 21 0 - 32 IU/L  Lipid Panel w/o Chol/HDL Ratio  Result Value Ref Range   Cholesterol, Total 232 (H) 100 - 199 mg/dL   Triglycerides 475 (H) 0 - 149 mg/dL   HDL 32 (L) >39 mg/dL   VLDL Cholesterol Cal Comment 5 - 40 mg/dL   LDL Calculated Comment 0 - 99 mg/dL  TSH  Result Value Ref Range   TSH 0.305 (L) 0.450 - 4.500 uIU/mL  UA/M w/rflx Culture, Routine  Result Value Ref Range   Specific Gravity, UA 1.020 1.005 - 1.030   pH, UA 5.5 5.0 - 7.5   Color, UA Yellow Yellow   Appearance Ur Cloudy (A) Clear   Leukocytes, UA 1+ (A) Negative   Protein, UA Negative Negative/Trace   Glucose, UA Negative Negative   Ketones, UA Negative Negative   RBC, UA Negative Negative   Bilirubin, UA Negative Negative   Urobilinogen, Ur 0.2 0.2 - 1.0 mg/dL   Nitrite, UA Positive (A) Negative   Microscopic Examination See below:    Urinalysis Reflex Comment   HgB A1c  Result Value Ref Range   Hgb A1c MFr Bld 5.4 4.8 - 5.6 %   Est. average glucose Bld gHb Est-mCnc 108 mg/dL  Sed Rate (ESR)  Result Value Ref Range   Sed Rate 2 0 - 32 mm/hr  Rheumatoid Factor  Result Value Ref Range   Rhuematoid fact SerPl-aCnc <10.0 0.0 - 13.9 IU/mL  Urine Culture, Routine  Result Value Ref Range   Urine Culture, Routine Final report (A)    Urine Culture result 1 Klebsiella  pneumoniae (A)    ANTIMICROBIAL SUSCEPTIBILITY Comment       Assessment &  Plan:   Problem List Items Addressed This Visit      Endocrine   Hypothyroidism    Will change dose to 37mg and recheck in 6 weeks.       Relevant Medications   levothyroxine (SYNTHROID, LEVOTHROID) 88 MCG tablet   Other Relevant Orders   Thyroid Panel With TSH    Other Visit Diagnoses    Screening examination for STD (sexually transmitted disease)    -  Primary   Labs checked today. Await results.    Relevant Orders   RPR   HIV antibody   GC/Chlamydia Probe Amp   HSV(herpes simplex vrs) 1+2 ab-IgG   Hepatitis, Acute   Acute cystitis without hematuria       Better on abx. Rechecking urine today. UA clear.   Relevant Orders   UA/M w/rflx Culture, Routine   Mixed hyperlipidemia       Not fasting last visit. Rechecking labs today.   Relevant Orders   Lipid Panel w/o Chol/HDL Ratio       Follow up plan: Return lab visit 6 weeks.

## 2016-05-09 NOTE — Assessment & Plan Note (Signed)
Will change dose to and recheck in 6 weeks.

## 2016-05-09 NOTE — Telephone Encounter (Signed)
Patient came in for visit, lab results discussed by Dr. Laural BenesJohnson.

## 2016-05-10 ENCOUNTER — Other Ambulatory Visit: Payer: Managed Care, Other (non HMO)

## 2016-05-10 LAB — HIV ANTIBODY (ROUTINE TESTING W REFLEX): HIV SCREEN 4TH GENERATION: NONREACTIVE

## 2016-05-10 LAB — HSV(HERPES SIMPLEX VRS) I + II AB-IGG
HSV 1 GLYCOPROTEIN G AB, IGG: 13.3 {index} — AB (ref 0.00–0.90)
HSV 2 Glycoprotein G Ab, IgG: <0.91 index (ref 0.00–0.90)

## 2016-05-10 LAB — HEPATITIS PANEL, ACUTE
HEP B C IGM: NEGATIVE
Hep A IgM: NEGATIVE
Hep C Virus Ab: <0.1 s/co ratio (ref 0.0–0.9)
Hepatitis B Surface Ag: NEGATIVE

## 2016-05-10 LAB — LIPID PANEL W/O CHOL/HDL RATIO
CHOLESTEROL TOTAL: 243 mg/dL — AB (ref 100–199)
HDL: 30 mg/dL — ABNORMAL LOW (ref >39)
LDL Calculated: 146 mg/dL — ABNORMAL HIGH (ref 0–99)
Triglycerides: 336 mg/dL — ABNORMAL HIGH (ref 0–149)
VLDL CHOLESTEROL CAL: 67 mg/dL — AB (ref 5–40)

## 2016-05-10 LAB — RPR: RPR: NONREACTIVE

## 2016-05-11 LAB — GC/CHLAMYDIA PROBE AMP
CHLAMYDIA, DNA PROBE: NEGATIVE
Neisseria gonorrhoeae by PCR: NEGATIVE

## 2016-05-28 ENCOUNTER — Other Ambulatory Visit: Payer: Self-pay | Admitting: Family Medicine

## 2016-05-28 NOTE — Telephone Encounter (Signed)
Looks like she didn't get her thyroid drawn when she was here- can she stop by just to have it drawn?

## 2016-05-28 NOTE — Telephone Encounter (Signed)
Patient was seen on 05/01/16 by Fleet Contrasachel, at that time her thyroid was checked, and the dose was decreased. She does not believe that she needs to come back in, nor does she need the refill.

## 2016-05-28 NOTE — Telephone Encounter (Signed)
Called and left a message asking patient to come to the office and get her thyroid checked.

## 2016-06-14 ENCOUNTER — Encounter: Payer: Self-pay | Admitting: Family Medicine

## 2016-06-14 ENCOUNTER — Ambulatory Visit (INDEPENDENT_AMBULATORY_CARE_PROVIDER_SITE_OTHER): Payer: Managed Care, Other (non HMO) | Admitting: Family Medicine

## 2016-06-14 VITALS — BP 119/77 | HR 63 | Temp 97.6°F | Wt 133.0 lb

## 2016-06-14 DIAGNOSIS — H1032 Unspecified acute conjunctivitis, left eye: Secondary | ICD-10-CM

## 2016-06-14 MED ORDER — POLYMYXIN B-TRIMETHOPRIM 10000-0.1 UNIT/ML-% OP SOLN: 1.0000 [drp] | OPHTHALMIC | 0 refills | Status: DC

## 2016-06-14 NOTE — Progress Notes (Signed)
   BP 119/77   Pulse 63   Temp 97.6 F (36.4 C)   Wt 133 lb (60.3 kg)   LMP  (LMP Unknown)   SpO2 100%   BMI 24.17 kg/m    Subjective:    Patient ID: Vicki Schultz, female    DOB: 21-Sep-1970, 46 y.o.   MRN: [id number]  HPI: Vicki Schultz is a 46 y.o. female  Chief Complaint  Patient presents with  . Conjunctivitis    x 1 day, left eye, redness, itching, drainage, matted shut in the mornings   Patient presents with 1 day of left eye redness, itching, drainage. Eye was crusted shut this morning when she woke up. Also having slight HA and blurry vision related to the copious drainage. Denies congestion, cough, sore throat, ear pain, fever, chills. Does not wear contacts, has glasses but feels like they make her sight worse so she rarely uses them. Using OTC pink eye remedy with no relief. No sick contacts.   Relevant past medical, surgical, family and social history reviewed and updated as indicated. Interim medical history since our last visit reviewed. Allergies and medications reviewed and updated.  Review of Systems  Constitutional: Negative.   HENT: Negative.   Eyes: Positive for discharge, redness, itching and visual disturbance (blurry).  Respiratory: Negative.   Cardiovascular: Negative.   Genitourinary: Negative.   Musculoskeletal: Negative.   Neurological: Positive for headaches.  Psychiatric/Behavioral: Negative.     Per HPI unless specifically indicated above     Objective:    BP 119/77   Pulse 63   Temp 97.6 F (36.4 C)   Wt 133 lb (60.3 kg)   LMP  (LMP Unknown)   SpO2 100%   BMI 24.17 kg/m   Wt Readings from Last 3 Encounters:  06/14/16 133 lb (60.3 kg)  05/09/16 131 lb 9.6 oz (59.7 kg)  05/01/16 132 lb 3.2 oz (60 kg)    Physical Exam  Constitutional: She is oriented to person, place, and time. She appears well-developed and well-nourished. No distress.  HENT:  Head: Atraumatic.  Eyes: EOM are normal. Pupils are equal, round, and  reactive to light. Left eye exhibits discharge. No scleral icterus.  Left conjunctiva erythematous diffusely  Neck: Normal range of motion. Neck supple.  Cardiovascular: Normal rate, regular rhythm and normal heart sounds.   Pulmonary/Chest: Effort normal and breath sounds normal. No respiratory distress.  Musculoskeletal: Normal range of motion.  Lymphadenopathy:    She has no cervical adenopathy.  Neurological: She is alert and oriented to person, place, and time.  Skin: Skin is warm and dry.  Psychiatric: She has a normal mood and affect. Her behavior is normal.  Nursing note and vitals reviewed.   Visual Acuity: without correction Left: 20/25 Right: 20/20     Assessment & Plan:   Problem List Items Addressed This Visit    None    Visit Diagnoses    Acute conjunctivitis of left eye, unspecified acute conjunctivitis type    -  Primary   Unclear whether viral or bacterial at this point. Will treat with polytrim drops and cool compresses for comfort. Good hand hygiene.        Follow up plan: Return if symptoms worsen or fail to improve.

## 2016-06-14 NOTE — Patient Instructions (Signed)
Follow up as needed

## 2016-07-18 ENCOUNTER — Other Ambulatory Visit: Payer: Self-pay | Admitting: Family Medicine

## 2016-07-22 ENCOUNTER — Other Ambulatory Visit: Payer: Self-pay | Admitting: Family Medicine

## 2016-08-22 ENCOUNTER — Other Ambulatory Visit: Payer: Self-pay | Admitting: Family Medicine

## 2016-09-19 ENCOUNTER — Other Ambulatory Visit: Payer: Self-pay | Admitting: Family Medicine

## 2016-10-17 ENCOUNTER — Other Ambulatory Visit: Payer: Self-pay | Admitting: Family Medicine

## 2016-10-17 NOTE — Telephone Encounter (Signed)
Needs lab drawn before refill

## 2016-10-17 NOTE — Telephone Encounter (Signed)
Left message for patient to let her know that she needs labs drawn before her medication can be refilled.

## 2016-11-05 ENCOUNTER — Other Ambulatory Visit: Payer: Managed Care, Other (non HMO)

## 2016-11-05 DIAGNOSIS — E039 Hypothyroidism, unspecified: Secondary | ICD-10-CM

## 2016-11-06 ENCOUNTER — Other Ambulatory Visit: Payer: Self-pay | Admitting: Family Medicine

## 2016-11-06 LAB — THYROID PANEL WITH TSH
Free Thyroxine Index: 2.1 (ref 1.2–4.9)
T3 UPTAKE RATIO: 28 % (ref 24–39)
T4 TOTAL: 7.6 ug/dL (ref 4.5–12.0)
TSH: 4.32 u[IU]/mL (ref 0.450–4.500)

## 2016-11-06 MED ORDER — LEVOTHYROXINE SODIUM 88 MCG PO TABS: 88.0000 ug | ORAL_TABLET | Freq: Every day | ORAL | 12 refills | Status: DC

## 2017-02-14 ENCOUNTER — Ambulatory Visit (INDEPENDENT_AMBULATORY_CARE_PROVIDER_SITE_OTHER): Payer: Managed Care, Other (non HMO) | Admitting: Family Medicine

## 2017-02-14 ENCOUNTER — Encounter: Payer: Self-pay | Admitting: Family Medicine

## 2017-02-14 VITALS — BP 114/74 | HR 64 | Temp 98.8°F | Wt 132.4 lb

## 2017-02-14 DIAGNOSIS — Z720 Tobacco use: Secondary | ICD-10-CM | POA: Diagnosis not present

## 2017-02-14 DIAGNOSIS — Z23 Encounter for immunization: Secondary | ICD-10-CM

## 2017-02-14 MED ORDER — BUPROPION HCL ER (SR) 150 MG PO TB12: ORAL_TABLET | ORAL | 3 refills | Status: DC

## 2017-02-14 MED ORDER — ACYCLOVIR 400 MG PO TABS: ORAL_TABLET | ORAL | 4 refills | Status: DC

## 2017-02-14 NOTE — Progress Notes (Signed)
BP 114/74 (BP Location: Left Arm, Patient Position: Sitting, Cuff Size: Normal)   Pulse 64   Temp 98.8 F (37.1 C)   Wt 132 lb 7 oz (60.1 kg)   LMP  (LMP Unknown)   SpO2 98%   BMI 24.07 kg/m    Subjective:    Patient ID: Vicki Schultz, female    DOB: 1971-04-06, 46 y.o.   MRN: [id number]  HPI: Vicki Schultz is a 46 y.o. female  Chief Complaint  Patient presents with  . Nicotine Dependence   SMOKING CESSATION Smoking Status: current every day smoker Smoking Amount: 1ppd Smoking Onset: 46yo Smoking Quit Date: not set Smoking triggers: food, getting in her car, break time at work, waking up Type of tobacco use: cigarettes Children in the house: yes Other household members who smoke: no Treatments attempted: chantix- made her feel depressed, patches  Pneumovax:   Relevant past medical, surgical, family and social history reviewed and updated as indicated. Interim medical history since our last visit reviewed. Allergies and medications reviewed and updated.  Review of Systems  Constitutional: Negative.   Respiratory: Negative.   Cardiovascular: Negative.   Psychiatric/Behavioral: Negative.     Per HPI unless specifically indicated above     Objective:    BP 114/74 (BP Location: Left Arm, Patient Position: Sitting, Cuff Size: Normal)   Pulse 64   Temp 98.8 F (37.1 C)   Wt 132 lb 7 oz (60.1 kg)   LMP  (LMP Unknown)   SpO2 98%   BMI 24.07 kg/m   Wt Readings from Last 3 Encounters:  02/14/17 132 lb 7 oz (60.1 kg)  06/14/16 133 lb (60.3 kg)  05/09/16 131 lb 9.6 oz (59.7 kg)    Physical Exam  Constitutional: She is oriented to person, place, and time. She appears well-developed and well-nourished. No distress.  HENT:  Head: Normocephalic and atraumatic.  Right Ear: Hearing normal.  Left Ear: Hearing normal.  Nose: Nose normal.  Eyes: Conjunctivae and lids are normal. Right eye exhibits no discharge. Left eye exhibits no discharge. No scleral  icterus.  Cardiovascular: Normal rate, regular rhythm, normal heart sounds and intact distal pulses.  Exam reveals no gallop and no friction rub.   No murmur heard. Pulmonary/Chest: Effort normal and breath sounds normal. No respiratory distress. She has no wheezes. She has no rales. She exhibits no tenderness.  Musculoskeletal: Normal range of motion.  Neurological: She is alert and oriented to person, place, and time.  Skin: Skin is warm, dry and intact. No rash noted. She is not diaphoretic. No erythema. No pallor.  Psychiatric: She has a normal mood and affect. Her speech is normal and behavior is normal. Judgment and thought content normal. Cognition and memory are normal.  Nursing note and vitals reviewed.   Results for orders placed or performed in visit on 11/05/16  Thyroid Panel With TSH  Result Value Ref Range   TSH 4.320 0.450 - 4.500 uIU/mL   T4, Total 7.6 4.5 - 12.0 ug/dL   T3 Uptake Ratio 28 24 - 39 %   Free Thyroxine Index 2.1 1.2 - 4.9      Assessment & Plan:   Problem List Items Addressed This Visit      Other   Tobacco abuse - Primary    Will start her on her wellbutrin. Recheck in about a month. If needs nicoderm, will send them through for her. She will call.       Relevant Orders  Pneumococcal polysaccharide vaccine 23-valent greater than or equal to 2yo subcutaneous/IM    Other Visit Diagnoses    Immunization due       Flu and pneumovax given today.   Relevant Orders   Flu Vaccine QUAD 6+ mos PF IM (Fluarix Quad PF) (Completed)       Follow up plan: Return in about 4 weeks (around 03/14/2017) for follow up smoking.

## 2017-02-14 NOTE — Patient Instructions (Addendum)
Influenza (Flu) Vaccine (Inactivated or Recombinant): What You Need to Know 1. Why get vaccinated? Influenza ("flu") is a contagious disease that spreads around the [place] every year, usually between October and May. Flu is caused by influenza viruses, and is spread mainly by coughing, sneezing, and close contact. Anyone can get flu. Flu strikes suddenly and can last several days. Symptoms vary by age, but can include:  fever/chills  sore throat  muscle aches  fatigue  cough  headache  runny or stuffy nose  Flu can also lead to pneumonia and blood infections, and cause diarrhea and seizures in children. If you have a medical condition, such as heart or lung disease, flu can make it worse. Flu is more dangerous for some people. Infants and young children, people 65 years of age and older, pregnant women, and people with certain health conditions or a weakened immune system are at greatest risk. Each year thousands of people in the [place] die from flu, and many more are hospitalized. Flu vaccine can:  keep you from getting flu,  make flu less severe if you do get it, and  keep you from spreading flu to your family and other people. 2. Inactivated and recombinant flu vaccines A dose of flu vaccine is recommended every flu season. Children 6 months through 8 years of age may need two doses during the same flu season. Everyone else needs only one dose each flu season. Some inactivated flu vaccines contain a very small amount of a mercury-based preservative called thimerosal. Studies have not shown thimerosal in vaccines to be harmful, but flu vaccines that do not contain thimerosal are available. There is no live flu virus in flu shots. They cannot cause the flu. There are many flu viruses, and they are always changing. Each year a new flu vaccine is made to protect against three or four viruses that are likely to cause disease in the upcoming flu season. But even when the  vaccine doesn't exactly match these viruses, it may still provide some protection. Flu vaccine cannot prevent:  flu that is caused by a virus not covered by the vaccine, or  illnesses that look like flu but are not.  It takes about 2 weeks for protection to develop after vaccination, and protection lasts through the flu season. 3. Some people should not get this vaccine Tell the person who is giving you the vaccine:  If you have any severe, life-threatening allergies. If you ever had a life-threatening allergic reaction after a dose of flu vaccine, or have a severe allergy to any part of this vaccine, you may be advised not to get vaccinated. Most, but not all, types of flu vaccine contain a small amount of egg protein.  If you ever had Guillain-Barr Syndrome (also called GBS). Some people with a history of GBS should not get this vaccine. This should be discussed with your doctor.  If you are not feeling well. It is usually okay to get flu vaccine when you have a mild illness, but you might be asked to come back when you feel better.  4. Risks of a vaccine reaction With any medicine, including vaccines, there is a chance of reactions. These are usually mild and go away on their own, but serious reactions are also possible. Most people who get a flu shot do not have any problems with it. Minor problems following a flu shot include:  soreness, redness, or swelling where the shot was given  hoarseness  sore,   red or itchy eyes  cough  fever  aches  headache  itching  fatigue  If these problems occur, they usually begin soon after the shot and last 1 or 2 days. More serious problems following a flu shot can include the following:  There may be a small increased risk of Guillain-Barre Syndrome (GBS) after inactivated flu vaccine. This risk has been estimated at 1 or 2 additional cases per million people vaccinated. This is much lower than the risk of severe complications from  flu, which can be prevented by flu vaccine.  Young children who get the flu shot along with pneumococcal vaccine (PCV13) and/or DTaP vaccine at the same time might be slightly more likely to have a seizure caused by fever. Ask your doctor for more information. Tell your doctor if a child who is getting flu vaccine has ever had a seizure.  Problems that could happen after any injected vaccine:  People sometimes faint after a medical procedure, including vaccination. Sitting or lying down for about 15 minutes can help prevent fainting, and injuries caused by a fall. Tell your doctor if you feel dizzy, or have vision changes or ringing in the ears.  Some people get severe pain in the shoulder and have difficulty moving the arm where a shot was given. This happens very rarely.  Any medication can cause a severe allergic reaction. Such reactions from a vaccine are very rare, estimated at about 1 in a million doses, and would happen within a few minutes to a few hours after the vaccination. As with any medicine, there is a very remote chance of a vaccine causing a serious injury or death. The safety of vaccines is always being monitored. For more information, visit: http://www.aguilar.org/ 5. What if there is a serious reaction? What should I look for? Look for anything that concerns you, such as signs of a severe allergic reaction, very high fever, or unusual behavior. Signs of a severe allergic reaction can include hives, swelling of the face and throat, difficulty breathing, a fast heartbeat, dizziness, and weakness. These would start a few minutes to a few hours after the vaccination. What should I do?  If you think it is a severe allergic reaction or other emergency that can't wait, call 9-1-1 and get the person to the nearest hospital. Otherwise, call your doctor.  Reactions should be reported to the Vaccine Adverse Event Reporting System (VAERS). Your doctor should file this report, or you  can do it yourself through the VAERS web site at www.vaers.SamedayNews.es, or by calling 6094730752. ? VAERS does not give medical advice. 6. The National Vaccine Injury Compensation Program The [employer] Vaccine Injury Compensation Program (VICP) is a federal program that was created to compensate people who may have been injured by certain vaccines. Persons who believe they may have been injured by a vaccine can learn about the program and about filing a claim by calling 458-267-6070 or visiting the [employer] website at GoldCloset.com.ee. There is a time limit to file a claim for compensation. 7. How can I learn more?  Ask your healthcare provider. He or she can give you the vaccine package insert or suggest other sources of information.  Call your local or state health department.  Contact the Centers for Disease Control and Prevention (CDC): ? Call (540)164-9661 (1-800-CDC-INFO) or ? Visit CDC's website at https://gibson.com/ Vaccine Information Statement, Inactivated Influenza Vaccine (12/17/2013) This information is not intended to replace advice given to you by your health care provider. Make sure  you discuss any questions you have with your health care provider. Document Released: 02/21/2006 Document Revised: 01/18/2016 Document Reviewed: 01/18/2016 Elsevier Interactive Patient Education  2017 Elsevier Inc. Pneumococcal Vaccine, Polyvalent solution for injection What is this medicine? PNEUMOCOCCAL VACCINE, POLYVALENT (NEU mo KOK al vak SEEN, pol ee VEY luhnt) is a vaccine to prevent pneumococcus bacteria infection. These bacteria are a major cause of ear infections, Strep throat infections, and serious pneumonia, meningitis, or blood infections worldwide. These vaccines help the body to produce antibodies (protective substances) that help your body defend against these bacteria. This vaccine is recommended for people 68 years of age and older with health problems. It is also  recommended for all adults over 35 years old. This vaccine will not treat an infection. This medicine may be used for other purposes; ask your health care provider or pharmacist if you have questions. COMMON BRAND NAME(S): Pneumovax 23 What should I tell my health care provider before I take this medicine? They need to know if you have any of these conditions: -bleeding problems -bone marrow or organ transplant -cancer, Hodgkin's disease -fever -infection -immune system problems -low platelet count in the blood -seizures -an unusual or allergic reaction to pneumococcal vaccine, diphtheria toxoid, other vaccines, latex, other medicines, foods, dyes, or preservatives -pregnant or trying to get pregnant -breast-feeding How should I use this medicine? This vaccine is for injection into a muscle or under the skin. It is given by a health care professional. A copy of Vaccine Information Statements will be given before each vaccination. Read this sheet carefully each time. The sheet may change frequently. Talk to your pediatrician regarding the use of this medicine in children. While this drug may be prescribed for children as young as 31 years of age for selected conditions, precautions do apply. Overdosage: If you think you have taken too much of this medicine contact a poison control center or emergency room at once. NOTE: This medicine is only for you. Do not share this medicine with others. What if I miss a dose? It is important not to miss your dose. Call your doctor or health care professional if you are unable to keep an appointment. What may interact with this medicine? -medicines for cancer chemotherapy -medicines that suppress your immune function -medicines that treat or prevent blood clots like warfarin, enoxaparin, and dalteparin -steroid medicines like prednisone or cortisone This list may not describe all possible interactions. Give your health care provider a list of all the  medicines, herbs, non-prescription drugs, or dietary supplements you use. Also tell them if you smoke, drink alcohol, or use illegal drugs. Some items may interact with your medicine. What should I watch for while using this medicine? Mild fever and pain should go away in 3 days or less. Report any unusual symptoms to your doctor or health care professional. What side effects may I notice from receiving this medicine? Side effects that you should report to your doctor or health care professional as soon as possible: -allergic reactions like skin rash, itching or hives, swelling of the face, lips, or tongue -breathing problems -confused -fever over 102 degrees F -pain, tingling, numbness in the hands or feet -seizures -unusual bleeding or bruising -unusual muscle weakness Side effects that usually do not require medical attention (report to your doctor or health care professional if they continue or are bothersome): -aches and pains -diarrhea -fever of 102 degrees F or less -headache -irritable -loss of appetite -pain, tender at site where injected -trouble sleeping  This list may not describe all possible side effects. Call your doctor for medical advice about side effects. You may report side effects to FDA at 1-800-FDA-1088. Where should I keep my medicine? This does not apply. This vaccine is given in a clinic, pharmacy, doctor's office, or other health care setting and will not be stored at home. NOTE: This sheet is a summary. It may not cover all possible information. If you have questions about this medicine, talk to your doctor, pharmacist, or health care provider.  2018 Elsevier/Gold Standard (2007-12-04 14:32:37)

## 2017-02-14 NOTE — Assessment & Plan Note (Addendum)
Will start her on her wellbutrin. Recheck in about a month. If needs nicoderm, will send them through for her. She will call.

## 2017-03-08 ENCOUNTER — Encounter: Payer: Self-pay | Admitting: Family Medicine

## 2017-03-10 MED ORDER — NICOTINE 14 MG/24HR TD PT24: 14.0000 mg | MEDICATED_PATCH | Freq: Every day | TRANSDERMAL | 3 refills | Status: DC

## 2017-03-14 ENCOUNTER — Ambulatory Visit: Payer: Managed Care, Other (non HMO) | Admitting: Family Medicine

## 2017-06-10 ENCOUNTER — Other Ambulatory Visit: Payer: Self-pay | Admitting: Family Medicine

## 2017-06-10 NOTE — Telephone Encounter (Signed)
Valtrex refill request Last OV 05/09/16.   Appt on 03/14/17 was cancelled by pt. Pharmacy is [id number], [place]  [address]

## 2017-06-10 NOTE — Telephone Encounter (Signed)
Just FYI in case something needs to be done with this. Please advise.  Thanks

## 2017-06-11 NOTE — Telephone Encounter (Signed)
Noted, refused valtrex as she's on acyclovir now it looks like

## 2017-06-28 ENCOUNTER — Other Ambulatory Visit: Payer: Self-pay | Admitting: Family Medicine

## 2017-07-24 ENCOUNTER — Encounter: Payer: Self-pay | Admitting: Family Medicine

## 2017-07-24 ENCOUNTER — Ambulatory Visit (INDEPENDENT_AMBULATORY_CARE_PROVIDER_SITE_OTHER): Payer: BLUE CROSS/BLUE SHIELD | Admitting: Family Medicine

## 2017-07-24 VITALS — BP 94/71 | HR 77 | Temp 98.7°F | Wt 140.5 lb

## 2017-07-24 DIAGNOSIS — R300 Dysuria: Secondary | ICD-10-CM

## 2017-07-24 DIAGNOSIS — N3000 Acute cystitis without hematuria: Secondary | ICD-10-CM

## 2017-07-24 DIAGNOSIS — E039 Hypothyroidism, unspecified: Secondary | ICD-10-CM

## 2017-07-24 LAB — UA/M W/RFLX CULTURE, ROUTINE
Bilirubin, UA: NEGATIVE
Glucose, UA: NEGATIVE
Ketones, UA: NEGATIVE
Nitrite, UA: NEGATIVE
PH UA: 5 (ref 5.0–7.5)
PROTEIN UA: NEGATIVE
RBC, UA: NEGATIVE
Specific Gravity, UA: 1.015 (ref 1.005–1.030)
Urobilinogen, Ur: 0.2 mg/dL (ref 0.2–1.0)

## 2017-07-24 LAB — MICROSCOPIC EXAMINATION: Bacteria, UA: NONE SEEN

## 2017-07-24 MED ORDER — CIPROFLOXACIN HCL 500 MG PO TABS: 500.0000 mg | ORAL_TABLET | Freq: Two times a day (BID) | ORAL | 0 refills | Status: DC

## 2017-07-24 MED ORDER — ACYCLOVIR 400 MG PO TABS: ORAL_TABLET | ORAL | 4 refills | Status: DC

## 2017-07-24 NOTE — Assessment & Plan Note (Signed)
Checking levels again today. Will adjust dose as needed. Call with any concerns.

## 2017-07-24 NOTE — Progress Notes (Signed)
BP 94/71   Pulse 77   Temp 98.7 F (37.1 C) (Oral)   Wt 140 lb 8 oz (63.7 kg)   LMP  (LMP Unknown)   SpO2 99%   BMI 25.53 kg/m    Subjective:    Patient ID: Vicki Schultz, female    DOB: 1970/07/11, 47 y.o.   MRN: [id number]  HPI: Vicki Schultz is a 47 y.o. female  Chief Complaint  Patient presents with  . Urinary Tract Infection    pt states she has had low back pain and burning with urination for 4 days   URINARY SYMPTOMS Duration: 5 days Dysuria: yes Urinary frequency: yes Urgency: yes Small volume voids: yes Symptom severity: moderate Urinary incontinence: no Foul odor: yes Hematuria: no Abdominal pain: no Back pain: yes Suprapubic pain/pressure: no Flank pain: yes Fever:  no Vomiting: no Relief with cranberry juice: no Relief with pyridium: no Status: stable Previous urinary tract infection: no Recurrent urinary tract infection: no Sexual activity: monogomous History of sexually transmitted disease: no Vaginal discharge: no Treatments attempted: cranberry and increasing fluids   HYPOTHYROIDISM Thyroid control status:stable Satisfied with current treatment? yes Medication side effects: no Medication compliance: good compliance Recent dose adjustment:no Fatigue: no Cold intolerance: no Heat intolerance: no Weight gain: no Weight loss: no Constipation: no Diarrhea/loose stools: no Palpitations: no Lower extremity edema: no Anxiety/depressed mood: no   Relevant past medical, surgical, family and social history reviewed and updated as indicated. Interim medical history since our last visit reviewed. Allergies and medications reviewed and updated.  Review of Systems  Constitutional: Negative.   Respiratory: Negative.   Cardiovascular: Negative.   Genitourinary: Positive for dysuria, flank pain, frequency and urgency. Negative for decreased urine volume, difficulty urinating, dyspareunia, enuresis, genital sores, hematuria,  menstrual problem, pelvic pain, vaginal bleeding, vaginal discharge and vaginal pain.  Musculoskeletal: Positive for back pain and myalgias. Negative for arthralgias, gait problem, joint swelling, neck pain and neck stiffness.  Skin: Negative.   Psychiatric/Behavioral: Negative.     Per HPI unless specifically indicated above     Objective:    BP 94/71   Pulse 77   Temp 98.7 F (37.1 C) (Oral)   Wt 140 lb 8 oz (63.7 kg)   LMP  (LMP Unknown)   SpO2 99%   BMI 25.53 kg/m   Wt Readings from Last 3 Encounters:  07/24/17 140 lb 8 oz (63.7 kg)  02/14/17 132 lb 7 oz (60.1 kg)  06/14/16 133 lb (60.3 kg)    Physical Exam  Constitutional: She is oriented to person, place, and time. She appears well-developed and well-nourished. No distress.  HENT:  Head: Normocephalic and atraumatic.  Right Ear: Hearing normal.  Left Ear: Hearing normal.  Nose: Nose normal.  Eyes: Conjunctivae and lids are normal. Right eye exhibits no discharge. Left eye exhibits no discharge. No scleral icterus.  Cardiovascular: Normal rate, regular rhythm, normal heart sounds and intact distal pulses. Exam reveals no gallop and no friction rub.  No murmur heard. Pulmonary/Chest: Effort normal and breath sounds normal. No respiratory distress. She has no wheezes. She has no rales. She exhibits no tenderness.  Abdominal: Soft. Bowel sounds are normal. She exhibits no distension and no mass. There is tenderness (suprapubic tenderness, negative CVA tenderness). There is no rebound and no guarding.  Musculoskeletal: Normal range of motion.  Neurological: She is alert and oriented to person, place, and time.  Skin: Skin is warm, dry and intact. No rash noted. She is  not diaphoretic. No erythema. No pallor.  Psychiatric: She has a normal mood and affect. Her speech is normal and behavior is normal. Judgment and thought content normal. Cognition and memory are normal.  Nursing note and vitals reviewed.   Results for  orders placed or performed in visit on 11/05/16  Thyroid Panel With TSH  Result Value Ref Range   TSH 4.320 0.450 - 4.500 uIU/mL   T4, Total 7.6 4.5 - 12.0 ug/dL   T3 Uptake Ratio 28 24 - 39 %   Free Thyroxine Index 2.1 1.2 - 4.9      Assessment & Plan:   Problem List Items Addressed This Visit      Endocrine   Hypothyroidism    Checking levels again today. Will adjust dose as needed. Call with any concerns.       Relevant Orders   TSH    Other Visit Diagnoses    Acute cystitis without hematuria    -  Primary   Will treat with cipro. Call with any concerns or if not getting better.    Burning with urination       + Leuks   Relevant Orders   UA/M w/rflx Culture, Routine       Follow up plan: Return in about 3 months (around 10/24/2017) for Physical.

## 2017-07-25 ENCOUNTER — Telehealth: Payer: Self-pay | Admitting: Family Medicine

## 2017-07-25 LAB — TSH: TSH: 8.15 u[IU]/mL — AB (ref 0.450–4.500)

## 2017-07-25 MED ORDER — LEVOTHYROXINE SODIUM 100 MCG PO TABS: 100.0000 ug | ORAL_TABLET | Freq: Every day | ORAL | 2 refills | Status: DC

## 2017-07-25 NOTE — Telephone Encounter (Signed)
Called patient with her results. Thyroid under treated. Will increase to 100mcg and recheck at her follow up in June.

## 2017-08-09 ENCOUNTER — Encounter: Payer: Self-pay | Admitting: Family Medicine

## 2017-08-12 ENCOUNTER — Ambulatory Visit: Payer: BLUE CROSS/BLUE SHIELD | Admitting: Family Medicine

## 2017-08-12 ENCOUNTER — Encounter: Payer: Self-pay | Admitting: Family Medicine

## 2017-08-12 VITALS — BP 114/78 | HR 68 | Temp 97.7°F | Wt 140.5 lb

## 2017-08-12 DIAGNOSIS — N760 Acute vaginitis: Secondary | ICD-10-CM | POA: Diagnosis not present

## 2017-08-12 DIAGNOSIS — A599 Trichomoniasis, unspecified: Secondary | ICD-10-CM | POA: Diagnosis not present

## 2017-08-12 DIAGNOSIS — N898 Other specified noninflammatory disorders of vagina: Secondary | ICD-10-CM

## 2017-08-12 DIAGNOSIS — B9689 Other specified bacterial agents as the cause of diseases classified elsewhere: Secondary | ICD-10-CM

## 2017-08-12 DIAGNOSIS — R300 Dysuria: Secondary | ICD-10-CM | POA: Diagnosis not present

## 2017-08-12 LAB — WET PREP FOR TRICH, YEAST, CLUE
CLUE CELL EXAM: POSITIVE — AB
Trichomonas Exam: POSITIVE — AB
YEAST EXAM: NEGATIVE

## 2017-08-12 MED ORDER — METRONIDAZOLE 500 MG PO TABS: 500.0000 mg | ORAL_TABLET | Freq: Two times a day (BID) | ORAL | 0 refills | Status: DC

## 2017-08-12 NOTE — Progress Notes (Signed)
BP 114/78 (BP Location: Right Arm, Patient Position: Sitting, Cuff Size: Normal)   Pulse 68   Temp 97.7 F (36.5 C) (Oral)   Wt 140 lb 8 oz (63.7 kg)   LMP  (LMP Unknown)   SpO2 95%   BMI 25.53 kg/m    Subjective:    Patient ID: Vicki Schultz, female    DOB: 13-May-1971, 47 y.o.   MRN: [id number]  HPI: Vicki Schultz is a 47 y.o. female  Chief Complaint  Patient presents with  . Urinary Tract Infection    odor, frequent urination, burning sensation when urinating and discharge.   URINARY SYMPTOMS Duration: about a month Dysuria: yes Urinary frequency: yes Urgency: yes Small volume voids: yes Symptom severity: moderate Urinary incontinence: no Foul odor: yes Hematuria: yes Abdominal pain: yes Back pain: usual chronic pain Suprapubic pain/pressure: no Flank pain: no Fever:  no Vomiting: no Relief with cranberry juice: no Relief with pyridium: no Status: better/worse/stable Previous urinary tract infection: yes Recurrent urinary tract infection: no History of sexually transmitted disease: no Vaginal discharge: yes Treatments attempted: antibiotics and increasing fluids    Relevant past medical, surgical, family and social history reviewed and updated as indicated. Interim medical history since our last visit reviewed. Allergies and medications reviewed and updated.  Review of Systems  Constitutional: Negative.   Respiratory: Negative.   Cardiovascular: Negative.   Genitourinary: Positive for frequency, hematuria, urgency and vaginal discharge. Negative for decreased urine volume, difficulty urinating, dyspareunia, dysuria, enuresis, flank pain, genital sores, menstrual problem, pelvic pain, vaginal bleeding and vaginal pain.  Psychiatric/Behavioral: Negative.     Per HPI unless specifically indicated above     Objective:    BP 114/78 (BP Location: Right Arm, Patient Position: Sitting, Cuff Size: Normal)   Pulse 68   Temp 97.7 F (36.5 C)  (Oral)   Wt 140 lb 8 oz (63.7 kg)   LMP  (LMP Unknown)   SpO2 95%   BMI 25.53 kg/m   Wt Readings from Last 3 Encounters:  08/12/17 140 lb 8 oz (63.7 kg)  07/24/17 140 lb 8 oz (63.7 kg)  02/14/17 132 lb 7 oz (60.1 kg)    Physical Exam  Constitutional: She is oriented to person, place, and time. She appears well-developed and well-nourished. No distress.  HENT:  Head: Normocephalic and atraumatic.  Right Ear: Hearing normal.  Left Ear: Hearing normal.  Nose: Nose normal.  Eyes: Conjunctivae and lids are normal. Right eye exhibits no discharge. Left eye exhibits no discharge. No scleral icterus.  Cardiovascular: Normal rate, regular rhythm, normal heart sounds and intact distal pulses. Exam reveals no gallop and no friction rub.  No murmur heard. Pulmonary/Chest: Effort normal and breath sounds normal. No respiratory distress. She has no wheezes. She has no rales. She exhibits no tenderness.  Genitourinary: Vaginal discharge found.  Genitourinary Comments: Mild prolapse, not outside of vaginal interoitus  Musculoskeletal: Normal range of motion.  Neurological: She is alert and oriented to person, place, and time.  Skin: Skin is warm, dry and intact. No rash noted. She is not diaphoretic. No erythema. No pallor.  Psychiatric: She has a normal mood and affect. Her speech is normal and behavior is normal. Judgment and thought content normal. Cognition and memory are normal.  Nursing note and vitals reviewed.   Results for orders placed or performed in visit on 08/12/17  WET PREP FOR TRICH, YEAST, CLUE  Result Value Ref Range   Trichomonas Exam Positive (A) Negative  Yeast Exam Negative Negative   Clue Cell Exam Positive (A) Negative  Microscopic Examination  Result Value Ref Range   WBC, UA 0-5 0 - 5 /hpf   RBC, UA 0-2 0 - 2 /hpf   Epithelial Cells (non renal) 0-10 0 - 10 /hpf   Renal Epithel, UA 0-10 (A) None seen /hpf   Bacteria, UA Few None seen/Few  Urine Culture, Reflex    Result Value Ref Range   Urine Culture, Routine WILL FOLLOW   UA/M w/rflx Culture, Routine  Result Value Ref Range   Specific Gravity, UA 1.010 1.005 - 1.030   pH, UA 5.5 5.0 - 7.5   Color, UA Yellow Yellow   Appearance Ur Hazy (A) Clear   Leukocytes, UA 1+ (A) Negative   Protein, UA Negative Negative/Trace   Glucose, UA Negative Negative   Ketones, UA Negative Negative   RBC, UA Trace (A) Negative   Bilirubin, UA Negative Negative   Urobilinogen, Ur 0.2 0.2 - 1.0 mg/dL   Nitrite, UA Negative Negative   Microscopic Examination See below:    Urinalysis Reflex Comment       Assessment & Plan:   Problem List Items Addressed This Visit    None    Visit Diagnoses    BV (bacterial vaginosis)    -  Primary   Will treat with metronidazole. Rx sent to her pharmacy. Call with any concerns or if not getting better.    Relevant Medications   metroNIDAZOLE (FLAGYL) 500 MG tablet   Trichimoniasis       Will treat with metronidazole. Rx sent to her pharmacy. Call with any concerns or if not getting better.    Relevant Medications   metroNIDAZOLE (FLAGYL) 500 MG tablet   Dysuria       UA positive for leuks- will await culture.    Relevant Orders   UA/M w/rflx Culture, Routine (Completed)   Vaginal discharge       + for clue cells and trich. Will treat.    Relevant Orders   WET PREP FOR TRICH, YEAST, CLUE (Completed)       Follow up plan: Return if symptoms worsen or fail to improve.

## 2017-08-13 ENCOUNTER — Encounter: Payer: Self-pay | Admitting: Family Medicine

## 2017-08-14 NOTE — Telephone Encounter (Signed)
Copied from CRM [id number]. Topic: Inquiry >> Aug 14, 2017  1:45 PM Diana EvesHoyt, Maryann B wrote: Reason for CRM: pt calling checking on mychart message regarding the prescription Dr. Laural BenesJohnson prescribed some question and concerns.

## 2017-08-14 NOTE — Telephone Encounter (Signed)
Called Vicki Schultz back. She notes that she is concerned about the trichomonas. She notes that she had it in about 2011 when she was going through the health department. She is concerned that at that time, she thinks that she had allergic reaction to the medicine to it in the past, not a rash, but became very red and flushed with metronidazole 2g. Discussed that this is really the only treatment effective for trich. Will try lower dose over 7 days- will call her tomorrow morning to see how she does with the first dose, if has drug rash, will need desensititization for metronidazole. Discussed what to do if she starts with a rash.

## 2017-08-15 NOTE — Telephone Encounter (Signed)
Called patient. Has not taken her medicine yet. Will recheck in with her on Monday

## 2017-08-16 ENCOUNTER — Encounter: Payer: Self-pay | Admitting: Family Medicine

## 2017-08-17 LAB — UA/M W/RFLX CULTURE, ROUTINE
Bilirubin, UA: NEGATIVE
Glucose, UA: NEGATIVE
Ketones, UA: NEGATIVE
Nitrite, UA: NEGATIVE
Protein, UA: NEGATIVE
Specific Gravity, UA: 1.01 (ref 1.005–1.030)
Urobilinogen, Ur: 0.2 mg/dL (ref 0.2–1.0)
pH, UA: 5.5 (ref 5.0–7.5)

## 2017-08-17 LAB — URINE CULTURE, REFLEX

## 2017-08-17 LAB — MICROSCOPIC EXAMINATION

## 2017-08-18 ENCOUNTER — Telehealth: Payer: Self-pay | Admitting: Family Medicine

## 2017-08-18 MED ORDER — NITROFURANTOIN MONOHYD MACRO 100 MG PO CAPS: 100.0000 mg | ORAL_CAPSULE | Freq: Two times a day (BID) | ORAL | 0 refills | Status: DC

## 2017-08-18 NOTE — Telephone Encounter (Signed)
Called patient. She is feeling better. Needs desensitization for flagyl, We are trying to find where we can get this done, at this time, unsure.   Discussed UTI results- will treat with nitrofurantoin. Will still not treat trich. If cannot find allergist who can do desensitization, will reach out to ID for their advice.

## 2017-08-18 NOTE — Telephone Encounter (Signed)
Copied from CRM [id number]. Topic: Inquiry >> Aug 14, 2017  1:45 PM Diana EvesHoyt, Maryann B wrote: Reason for CRM: pt calling checking on mychart message regarding the prescription Dr. Laural BenesJohnson prescribed some question and concerns.   >> Aug 18, 2017  3:12 PM Rudi CocoLathan, Cassie Shedlock M, NT wrote: Pt. Calling back to speak with Dr. Laural BenesJohnson or send another message on mychart

## 2017-08-19 NOTE — Telephone Encounter (Signed)
Patient notified

## 2017-08-19 NOTE — Telephone Encounter (Signed)
Can you please let her know about allergy appointment. To keep using condoms until treated.

## 2017-10-24 ENCOUNTER — Ambulatory Visit (INDEPENDENT_AMBULATORY_CARE_PROVIDER_SITE_OTHER): Payer: BLUE CROSS/BLUE SHIELD | Admitting: Family Medicine

## 2017-10-24 ENCOUNTER — Encounter: Payer: Self-pay | Admitting: Family Medicine

## 2017-10-24 VITALS — BP 112/76 | HR 67 | Temp 98.6°F | Wt 135.1 lb

## 2017-10-24 DIAGNOSIS — Z87892 Personal history of anaphylaxis: Secondary | ICD-10-CM

## 2017-10-24 DIAGNOSIS — Z1239 Encounter for other screening for malignant neoplasm of breast: Secondary | ICD-10-CM

## 2017-10-24 DIAGNOSIS — Z1231 Encounter for screening mammogram for malignant neoplasm of breast: Secondary | ICD-10-CM

## 2017-10-24 DIAGNOSIS — E039 Hypothyroidism, unspecified: Secondary | ICD-10-CM

## 2017-10-24 DIAGNOSIS — Z0001 Encounter for general adult medical examination with abnormal findings: Secondary | ICD-10-CM | POA: Diagnosis not present

## 2017-10-24 DIAGNOSIS — A599 Trichomoniasis, unspecified: Secondary | ICD-10-CM | POA: Diagnosis not present

## 2017-10-24 DIAGNOSIS — Z720 Tobacco use: Secondary | ICD-10-CM

## 2017-10-24 DIAGNOSIS — Z0000 Encounter for general adult medical examination without abnormal findings: Secondary | ICD-10-CM

## 2017-10-24 LAB — UA/M W/RFLX CULTURE, ROUTINE
Bilirubin, UA: NEGATIVE
GLUCOSE, UA: NEGATIVE
KETONES UA: NEGATIVE
NITRITE UA: NEGATIVE
PROTEIN UA: NEGATIVE
RBC, UA: NEGATIVE
Urobilinogen, Ur: 0.2 mg/dL (ref 0.2–1.0)
pH, UA: 5 (ref 5.0–7.5)

## 2017-10-24 LAB — WET PREP FOR TRICH, YEAST, CLUE
Clue Cell Exam: POSITIVE — AB
Trichomonas Exam: POSITIVE — AB
YEAST EXAM: NEGATIVE

## 2017-10-24 LAB — MICROSCOPIC EXAMINATION

## 2017-10-24 MED ORDER — BUPROPION HCL ER (SR) 150 MG PO TB12: ORAL_TABLET | ORAL | 2 refills | Status: DC

## 2017-10-24 MED ORDER — NICOTINE 14 MG/24HR TD PT24: 14.0000 mg | MEDICATED_PATCH | Freq: Every day | TRANSDERMAL | 2 refills | Status: DC

## 2017-10-24 NOTE — Assessment & Plan Note (Signed)
Feeling good. Rechecking levels today. Will adjust dose as needed. Call with any concerns.

## 2017-10-24 NOTE — Progress Notes (Signed)
BP 112/76 (BP Location: Left Arm, Patient Position: Sitting, Cuff Size: Normal)   Pulse 67   Temp 98.6 F (37 C)   Wt 135 lb 2 oz (61.3 kg)   LMP  (LMP Unknown)   SpO2 98%   BMI 24.56 kg/m    Subjective:    Patient ID: Vicki Schultz, female    DOB: 03-14-1971, 47 y.o.   MRN: [id number]  HPI: Vicki Schultz is a 47 y.o. female presenting on 10/24/2017 for comprehensive medical examination. Current medical complaints include:  HYPOTHYROIDISM Thyroid control status:stable Satisfied with current treatment? yes Medication side effects: no Medication compliance: excellent compliance Etiology of hypothyroidism:  Recent dose adjustment:yes Fatigue: yes Cold intolerance: yes Heat intolerance: no Weight gain: no Weight loss: no Constipation: yes Diarrhea/loose stools: no Palpitations: no Lower extremity edema: no Anxiety/depressed mood: no  Saw the allergist to discuss desensitization for flagyl- would require ICU admission. Would like to see if she still has trich. If she does- would like to see ID.   SMOKING CESSATION Smoking Status: current every day smoker Smoking Amount: 15 cigs/day and vaping Smoking Onset: 47yo Smoking Quit Date: not set Smoking triggers: food, car, coffee Type of tobacco use: cigarettes Children in the house: no Other household members who smoke: no Treatments attempted:  Pneumovax:   Menopausal Symptoms: yes  Depression Screen done today and results listed below:  [facility] 10/24/2017 08/12/2017 05/01/2016  Decreased Interest 0 0 0  Down, Depressed, Hopeless 0 0 0  PHQ - 2 Score 0 0 0  Altered sleeping 0 3 0  Tired, decreased energy 0 3 0  Change in appetite 0 0 0  Feeling bad or failure about yourself  0 0 0  Trouble concentrating 0 0 1  Moving slowly or fidgety/restless 0 - 0  Suicidal thoughts 0 0 0  PHQ-9 Score 0 6 1  Difficult doing work/chores Not difficult at all - -    Past Medical History:  Past  Medical History:  Diagnosis Date  . Thyroid disease     Surgical History:  Past Surgical History:  Procedure Laterality Date  . CESAREAN SECTION    . ROTATOR CUFF REPAIR Right 08/03/14  . TUBAL LIGATION      Medications:  Current Outpatient Medications on File Prior to Visit  Medication Sig  . acyclovir (ZOVIRAX) 400 MG tablet TAKE 1 TABLET BY MOUTH THREE TIMES DAILY AS SOON AS SYMPTOMS START  . EPINEPHrine 0.3 mg/0.3 mL IJ SOAJ injection INJ 1 SYRINGE IM ONCE UTD FOR 1 DOSE  . levothyroxine (SYNTHROID, LEVOTHROID) 88 MCG tablet    No current facility-administered medications on file prior to visit.     Allergies:  Allergies  Allergen Reactions  . Flagyl [Metronidazole] Anaphylaxis  . Codeine     Social History:  Social History   Socioeconomic History  . Marital status: Single    Spouse name: Not on file  . Number of children: Not on file  . Years of education: Not on file  . Highest education level: Not on file  Occupational History  . Not on file  Social Needs  . Financial resource strain: Not on file  . Food insecurity:    Worry: Not on file    Inability: Not on file  . Transportation needs:    Medical: Not on file    Non-medical: Not on file  Tobacco Use  . Smoking status: Current Every Day Smoker    Packs/day: 0.50  Types: Cigarettes  . Smokeless tobacco: Never Used  Substance and Sexual Activity  . Alcohol use: Yes    Alcohol/week: 0.0 oz    Comment: two or less per day  . Drug use: No  . Sexual activity: Yes  Lifestyle  . Physical activity:    Days per week: Not on file    Minutes per session: Not on file  . Stress: Not on file  Relationships  . Social connections:    Talks on phone: Not on file    Gets together: Not on file    Attends religious service: Not on file    Active member of club or organization: Not on file    Attends meetings of clubs or organizations: Not on file    Relationship status: Not on file  . Intimate partner  violence:    Fear of current or ex partner: Not on file    Emotionally abused: Not on file    Physically abused: Not on file    Forced sexual activity: Not on file  Other Topics Concern  . Not on file  Social History Narrative  . Not on file   Social History   Tobacco Use  Smoking Status Current Every Day Smoker  . Packs/day: 0.50  . Types: Cigarettes  Smokeless Tobacco Never Used   Social History   Substance and Sexual Activity  Alcohol Use Yes  . Alcohol/week: 0.0 oz   Comment: two or less per day    Family History:  Family History  Problem Relation Age of Onset  . Cancer Mother   . Hypertension Mother   . Mental illness Mother   . Thyroid disease Mother   . Diabetes Father   . Arthritis Sister   . Hypertension Sister     Past medical history, surgical history, medications, allergies, family history and social history reviewed with patient today and changes made to appropriate areas of the chart.   Review of Systems  Constitutional: Negative.   HENT: Negative.   Eyes: Negative.   Respiratory: Positive for cough. Negative for hemoptysis, sputum production, shortness of breath and wheezing.   Cardiovascular: Negative.   Gastrointestinal: Positive for heartburn. Negative for abdominal pain, blood in stool, constipation, diarrhea, melena, nausea and vomiting.  Genitourinary: Negative.   Musculoskeletal: Negative.   Skin: Negative.   Neurological: Negative.   Endo/Heme/Allergies: Negative for environmental allergies and polydipsia. Bruises/bleeds easily.  Psychiatric/Behavioral: Negative.     All other ROS negative except what is listed above and in the HPI.      Objective:    BP 112/76 (BP Location: Left Arm, Patient Position: Sitting, Cuff Size: Normal)   Pulse 67   Temp 98.6 F (37 C)   Wt 135 lb 2 oz (61.3 kg)   LMP  (LMP Unknown)   SpO2 98%   BMI 24.56 kg/m   Wt Readings from Last 3 Encounters:  10/24/17 135 lb 2 oz (61.3 kg)  08/12/17 140 lb 8  oz (63.7 kg)  07/24/17 140 lb 8 oz (63.7 kg)    Physical Exam  Constitutional: She is oriented to person, place, and time. She appears well-developed and well-nourished. No distress.  HENT:  Head: Normocephalic and atraumatic.  Right Ear: Hearing and external ear normal.  Left Ear: Hearing and external ear normal.  Nose: Nose normal.  Mouth/Throat: Oropharynx is clear and moist. No oropharyngeal exudate.  Eyes: Pupils are equal, round, and reactive to light. Conjunctivae, EOM and lids are normal. Right eye exhibits  no discharge. Left eye exhibits no discharge. No scleral icterus.  Neck: Normal range of motion. Neck supple. No JVD present. No tracheal deviation present. No thyromegaly present.  Cardiovascular: Normal rate, regular rhythm, normal heart sounds and intact distal pulses. Exam reveals no gallop and no friction rub.  No murmur heard. Pulmonary/Chest: Effort normal and breath sounds normal. No stridor. No respiratory distress. She has no wheezes. She has no rales. She exhibits no tenderness. Right breast exhibits no inverted nipple, no mass, no nipple discharge, no skin change and no tenderness. Left breast exhibits no inverted nipple, no mass, no nipple discharge, no skin change and no tenderness. No breast swelling, tenderness, discharge or bleeding. Breasts are symmetrical.  Abdominal: Soft. Bowel sounds are normal. She exhibits no distension and no mass. There is no tenderness. There is no rebound and no guarding. No hernia. Hernia confirmed negative in the right inguinal area and confirmed negative in the left inguinal area.  Genitourinary: No labial fusion. There is no rash, tenderness, lesion or injury on the right labia. There is no rash, tenderness, lesion or injury on the left labia. Vaginal discharge found.  Musculoskeletal: Normal range of motion. She exhibits no edema, tenderness or deformity.  Lymphadenopathy:    She has no cervical adenopathy.  Neurological: She is alert  and oriented to person, place, and time. She displays normal reflexes. No cranial nerve deficit or sensory deficit. She exhibits normal muscle tone. Coordination normal.  Skin: Skin is warm, dry and intact. Capillary refill takes less than 2 seconds. No rash noted. She is not diaphoretic. No erythema. No pallor.  Psychiatric: She has a normal mood and affect. Her speech is normal and behavior is normal. Judgment and thought content normal. Cognition and memory are normal.  Nursing note and vitals reviewed.   Results for orders placed or performed in visit on 10/24/17  HM PAP SMEAR  Result Value Ref Range   HM Pap smear Negative with Neg HPV       Assessment & Plan:   Problem List Items Addressed This Visit      Endocrine   Hypothyroidism    Feeling good. Rechecking levels today. Will adjust dose as needed. Call with any concerns.       Relevant Orders   TSH     Other   Tobacco abuse    Will start wellbutrin and nicoderm and recheck 1 month. Call with any concerns.        Other Visit Diagnoses    Routine general medical examination at a health care facility    -  Primary   Vaccines up to date. Screening labs checked today. Pap up to date. Mammogram ordered. Call with any concerns.    Relevant Orders   CBC with Differential/Platelet   Comprehensive metabolic panel   Lipid Panel w/o Chol/HDL Ratio   TSH   UA/M w/rflx Culture, Routine   Screening for breast cancer       Mammogram odered today.   Relevant Orders   MM DIGITAL SCREENING BILATERAL   Trichimoniasis       Anaphylaxic to flagyl. Other regimens not as effective. Will get her into ID for evaluation on treatment.    Relevant Orders   WET PREP FOR TRICH, YEAST, CLUE   Ambulatory referral to Infectious Disease   History of drug-induced anaphylaxis       Relevant Orders   Ambulatory referral to Infectious Disease       Follow up plan: Return in  about 1 month (around 11/21/2017) for follow up.   LABORATORY  TESTING:  - Pap smear: up to date  IMMUNIZATIONS:   - Tdap: Tetanus vaccination status reviewed: last tetanus booster within 10 years. - Influenza: Up to date - Pneumovax: Up to date  SCREENING: -Mammogram: Ordered today   PATIENT COUNSELING:   Advised to take 1 mg of folate supplement per day if capable of pregnancy.   Sexuality: Discussed sexually transmitted diseases, partner selection, use of condoms, avoidance of unintended pregnancy  and contraceptive alternatives.   Advised to avoid cigarette smoking.  I discussed with the patient that most people either abstain from alcohol or drink within safe limits (<=14/week and <=4 drinks/occasion for males, <=7/weeks and <= 3 drinks/occasion for females) and that the risk for alcohol disorders and other health effects rises proportionally with the number of drinks per week and how often a drinker exceeds daily limits.  Discussed cessation/primary prevention of drug use and availability of treatment for abuse.   Diet: Encouraged to adjust caloric intake to maintain  or achieve ideal body weight, to reduce intake of dietary saturated fat and total fat, to limit sodium intake by avoiding high sodium foods and not adding table salt, and to maintain adequate dietary potassium and calcium preferably from fresh fruits, vegetables, and low-fat dairy products.    stressed the importance of regular exercise  Injury prevention: Discussed safety belts, safety helmets, smoke detector, smoking near bedding or upholstery.   Dental health: Discussed importance of regular tooth brushing, flossing, and dental visits.    NEXT PREVENTATIVE PHYSICAL DUE IN 1 YEAR. Return in about 1 month (around 11/21/2017) for follow up.

## 2017-10-24 NOTE — Patient Instructions (Signed)
Health Maintenance for Postmenopausal Women Menopause is a normal process in which your reproductive ability comes to an end. This process happens gradually over a span of months to years, usually between the ages of 22 and 9. Menopause is complete when you have missed 12 consecutive menstrual periods. It is important to talk with your health care provider about some of the most common conditions that affect postmenopausal women, such as heart disease, cancer, and bone loss (osteoporosis). Adopting a healthy lifestyle and getting preventive care can help to promote your health and wellness. Those actions can also lower your chances of developing some of these common conditions. What should I know about menopause? During menopause, you may experience a number of symptoms, such as:  Moderate-to-severe hot flashes.  Night sweats.  Decrease in sex drive.  Mood swings.  Headaches.  Tiredness.  Irritability.  Memory problems.  Insomnia.  Choosing to treat or not to treat menopausal changes is an individual decision that you make with your health care provider. What should I know about hormone replacement therapy and supplements? Hormone therapy products are effective for treating symptoms that are associated with menopause, such as hot flashes and night sweats. Hormone replacement carries certain risks, especially as you become older. If you are thinking about using estrogen or estrogen with progestin treatments, discuss the benefits and risks with your health care provider. What should I know about heart disease and stroke? Heart disease, heart attack, and stroke become more likely as you age. This may be due, in part, to the hormonal changes that your body experiences during menopause. These can affect how your body processes dietary fats, triglycerides, and cholesterol. Heart attack and stroke are both medical emergencies. There are many things that you can do to help prevent heart disease  and stroke:  Have your blood pressure checked at least every 1-2 years. High blood pressure causes heart disease and increases the risk of stroke.  If you are 53-22 years old, ask your health care provider if you should take aspirin to prevent a heart attack or a stroke.  Do not use any tobacco products, including cigarettes, chewing tobacco, or electronic cigarettes. If you need help quitting, ask your health care provider.  It is important to eat a healthy diet and maintain a healthy weight. ? Be sure to include plenty of vegetables, fruits, low-fat dairy products, and lean protein. ? Avoid eating foods that are high in solid fats, added sugars, or salt (sodium).  Get regular exercise. This is one of the most important things that you can do for your health. ? Try to exercise for at least 150 minutes each week. The type of exercise that you do should increase your heart rate and make you sweat. This is known as moderate-intensity exercise. ? Try to do strengthening exercises at least twice each week. Do these in addition to the moderate-intensity exercise.  Know your numbers.Ask your health care provider to check your cholesterol and your blood glucose. Continue to have your blood tested as directed by your health care provider.  What should I know about cancer screening? There are several types of cancer. Take the following steps to reduce your risk and to catch any cancer development as early as possible. Breast Cancer  Practice breast self-awareness. ? This means understanding how your breasts normally appear and feel. ? It also means doing regular breast self-exams. Let your health care provider know about any changes, no matter how small.  If you are 40  or older, have a clinician do a breast exam (clinical breast exam or CBE) every year. Depending on your age, family history, and medical history, it may be recommended that you also have a yearly breast X-ray (mammogram).  If you  have a family history of breast cancer, talk with your health care provider about genetic screening.  If you are at high risk for breast cancer, talk with your health care provider about having an MRI and a mammogram every year.  Breast cancer (BRCA) gene test is recommended for women who have family members with BRCA-related cancers. Results of the assessment will determine the need for genetic counseling and BRCA1 and for BRCA2 testing. BRCA-related cancers include these types: ? Breast. This occurs in males or females. ? Ovarian. ? Tubal. This may also be called fallopian tube cancer. ? Cancer of the abdominal or pelvic lining (peritoneal cancer). ? Prostate. ? Pancreatic.  Cervical, Uterine, and Ovarian Cancer Your health care provider may recommend that you be screened regularly for cancer of the pelvic organs. These include your ovaries, uterus, and vagina. This screening involves a pelvic exam, which includes checking for microscopic changes to the surface of your cervix (Pap test).  For women ages 21-65, health care providers may recommend a pelvic exam and a Pap test every three years. For women ages 79-65, they may recommend the Pap test and pelvic exam, combined with testing for human papilloma virus (HPV), every five years. Some types of HPV increase your risk of cervical cancer. Testing for HPV may also be done on women of any age who have unclear Pap test results.  Other health care providers may not recommend any screening for nonpregnant women who are considered low risk for pelvic cancer and have no symptoms. Ask your health care provider if a screening pelvic exam is right for you.  If you have had past treatment for cervical cancer or a condition that could lead to cancer, you need Pap tests and screening for cancer for at least 20 years after your treatment. If Pap tests have been discontinued for you, your risk factors (such as having a new sexual partner) need to be  reassessed to determine if you should start having screenings again. Some women have medical problems that increase the chance of getting cervical cancer. In these cases, your health care provider may recommend that you have screening and Pap tests more often.  If you have a family history of uterine cancer or ovarian cancer, talk with your health care provider about genetic screening.  If you have vaginal bleeding after reaching menopause, tell your health care provider.  There are currently no reliable tests available to screen for ovarian cancer.  Lung Cancer Lung cancer screening is recommended for adults 69-62 years old who are at high risk for lung cancer because of a history of smoking. A yearly low-dose CT scan of the lungs is recommended if you:  Currently smoke.  Have a history of at least 30 pack-years of smoking and you currently smoke or have quit within the past 15 years. A pack-year is smoking an average of one pack of cigarettes per day for one year.  Yearly screening should:  Continue until it has been 15 years since you quit.  Stop if you develop a health problem that would prevent you from having lung cancer treatment.  Colorectal Cancer  This type of cancer can be detected and can often be prevented.  Routine colorectal cancer screening usually begins at  age 42 and continues through age 45.  If you have risk factors for colon cancer, your health care provider may recommend that you be screened at an earlier age.  If you have a family history of colorectal cancer, talk with your health care provider about genetic screening.  Your health care provider may also recommend using home test kits to check for hidden blood in your stool.  A small camera at the end of a tube can be used to examine your colon directly (sigmoidoscopy or colonoscopy). This is done to check for the earliest forms of colorectal cancer.  Direct examination of the colon should be repeated every  5-10 years until age 71. However, if early forms of precancerous polyps or small growths are found or if you have a family history or genetic risk for colorectal cancer, you may need to be screened more often.  Skin Cancer  Check your skin from head to toe regularly.  Monitor any moles. Be sure to tell your health care provider: ? About any new moles or changes in moles, especially if there is a change in a mole's shape or color. ? If you have a mole that is larger than the size of a pencil eraser.  If any of your family members has a history of skin cancer, especially at a young age, talk with your health care provider about genetic screening.  Always use sunscreen. Apply sunscreen liberally and repeatedly throughout the day.  Whenever you are outside, protect yourself by wearing long sleeves, pants, a wide-brimmed hat, and sunglasses.  What should I know about osteoporosis? Osteoporosis is a condition in which bone destruction happens more quickly than new bone creation. After menopause, you may be at an increased risk for osteoporosis. To help prevent osteoporosis or the bone fractures that can happen because of osteoporosis, the following is recommended:  If you are 46-71 years old, get at least 1,000 mg of calcium and at least 600 mg of vitamin D per day.  If you are older than age 55 but younger than age 65, get at least 1,200 mg of calcium and at least 600 mg of vitamin D per day.  If you are older than age 54, get at least 1,200 mg of calcium and at least 800 mg of vitamin D per day.  Smoking and excessive alcohol intake increase the risk of osteoporosis. Eat foods that are rich in calcium and vitamin D, and do weight-bearing exercises several times each week as directed by your health care provider. What should I know about how menopause affects my mental health? Depression may occur at any age, but it is more common as you become older. Common symptoms of depression  include:  Low or sad mood.  Changes in sleep patterns.  Changes in appetite or eating patterns.  Feeling an overall lack of motivation or enjoyment of activities that you previously enjoyed.  Frequent crying spells.  Talk with your health care provider if you think that you are experiencing depression. What should I know about immunizations? It is important that you get and maintain your immunizations. These include:  Tetanus, diphtheria, and pertussis (Tdap) booster vaccine.  Influenza every year before the flu season begins.  Pneumonia vaccine.  Shingles vaccine.  Your health care provider may also recommend other immunizations. This information is not intended to replace advice given to you by your health care provider. Make sure you discuss any questions you have with your health care provider. Document Released: 06/21/2005  Document Revised: 11/17/2015 Document Reviewed: 01/31/2015 Elsevier Interactive Patient Education  2018 [employer]  

## 2017-10-24 NOTE — Assessment & Plan Note (Signed)
Will start wellbutrin and nicoderm and recheck 1 month. Call with any concerns.

## 2017-10-25 LAB — COMPREHENSIVE METABOLIC PANEL
ALT: 24 IU/L (ref 0–32)
AST: 20 IU/L (ref 0–40)
Albumin/Globulin Ratio: 1.6 (ref 1.2–2.2)
Albumin: 4.6 g/dL (ref 3.5–5.5)
Alkaline Phosphatase: 93 IU/L (ref 39–117)
BUN/Creatinine Ratio: 12 (ref 9–23)
BUN: 11 mg/dL (ref 6–24)
Bilirubin Total: 0.3 mg/dL (ref 0.0–1.2)
CALCIUM: 9.8 mg/dL (ref 8.7–10.2)
CHLORIDE: 99 mmol/L (ref 96–106)
CO2: 25 mmol/L (ref 20–29)
CREATININE: 0.93 mg/dL (ref 0.57–1.00)
GFR calc Af Amer: 84 mL/min/{1.73_m2} (ref >59)
GFR, EST NON AFRICAN AMERICAN: 73 mL/min/{1.73_m2} (ref >59)
Globulin, Total: 2.9 g/dL (ref 1.5–4.5)
Glucose: 81 mg/dL (ref 65–99)
POTASSIUM: 3.8 mmol/L (ref 3.5–5.2)
Sodium: 140 mmol/L (ref 134–144)
Total Protein: 7.5 g/dL (ref 6.0–8.5)

## 2017-10-25 LAB — CBC WITH DIFFERENTIAL/PLATELET
Basophils Absolute: 0.1 10*3/uL (ref 0.0–0.2)
Basos: 1 %
EOS (ABSOLUTE): 0.5 10*3/uL — AB (ref 0.0–0.4)
EOS: 5 %
Hematocrit: 42.5 % (ref 34.0–46.6)
Hemoglobin: 14.7 g/dL (ref 11.1–15.9)
IMMATURE GRANULOCYTES: 0 %
Immature Grans (Abs): 0 10*3/uL (ref 0.0–0.1)
Lymphocytes Absolute: 4.2 10*3/uL — ABNORMAL HIGH (ref 0.7–3.1)
Lymphs: 40 %
MCH: 32.5 pg (ref 26.6–33.0)
MCHC: 34.6 g/dL (ref 31.5–35.7)
MCV: 94 fL (ref 79–97)
MONOS ABS: 0.6 10*3/uL (ref 0.1–0.9)
Monocytes: 6 %
NEUTROS PCT: 48 %
Neutrophils Absolute: 5 10*3/uL (ref 1.4–7.0)
PLATELETS: 273 10*3/uL (ref 150–450)
RBC: 4.53 x10E6/uL (ref 3.77–5.28)
RDW: 13 % (ref 12.3–15.4)
WBC: 10.4 10*3/uL (ref 3.4–10.8)

## 2017-10-25 LAB — TSH: TSH: 0.929 u[IU]/mL (ref 0.450–4.500)

## 2017-10-25 LAB — LIPID PANEL W/O CHOL/HDL RATIO
Cholesterol, Total: 257 mg/dL — ABNORMAL HIGH (ref 100–199)
HDL: 34 mg/dL — ABNORMAL LOW (ref >39)
Triglycerides: 413 mg/dL — ABNORMAL HIGH (ref 0–149)

## 2017-10-27 ENCOUNTER — Other Ambulatory Visit: Payer: Self-pay | Admitting: Family Medicine

## 2017-10-27 MED ORDER — LEVOTHYROXINE SODIUM 88 MCG PO TABS: 88.0000 ug | ORAL_TABLET | Freq: Every day | ORAL | 3 refills | Status: DC

## 2017-11-19 ENCOUNTER — Other Ambulatory Visit: Payer: Self-pay | Admitting: Family Medicine

## 2017-11-20 ENCOUNTER — Ambulatory Visit: Payer: BLUE CROSS/BLUE SHIELD | Admitting: Family

## 2017-11-20 ENCOUNTER — Encounter: Payer: Self-pay | Admitting: Family

## 2017-11-20 VITALS — BP 113/77 | HR 62 | Temp 97.8°F | Ht 62.0 in | Wt 136.0 lb

## 2017-11-20 DIAGNOSIS — A599 Trichomoniasis, unspecified: Secondary | ICD-10-CM | POA: Diagnosis not present

## 2017-11-20 MED ORDER — AMBULATORY NON FORMULARY MEDICATION: 0 refills | Status: DC

## 2017-11-20 NOTE — Assessment & Plan Note (Signed)
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 We discussed the treatment options that are available including using boric acid suppositories or the desensitization protocol for metronidazole. At this time she wishes to pursue the boric acid suppositories which she will perform twice daily for 60 days. She has been instructed to abstain from intercourse during this time. Recommend follow up testing with primary care provider at the completion of treatment to ensure resolution of infection. If remains positive, she will likely require metronidazole desensitization.

## 2017-11-20 NOTE — Progress Notes (Signed)
Subjective:    Patient ID: Vicki Schultz, female    DOB: September 01, 1970, 47 y.o.   MRN: [id number]  Chief Complaint  Patient presents with  . Trichimonas Infection    vaginal dryness, odor, discharge    HPI:  Vicki Schultz is a 47 y.o. female who presents today for an initial office visit for evaluation and treatment of trichomonas   Vicki Lyn Hollingshead was initially seen on 08/12/17 in her primary care office with the chief complaint of frequent urination, odor, dysuria, and discharge. She was found to positive for trichomonas and bacterial vaginosis. She was prescribed metronidazole and resulted in an anaphylaxis reaction having to go to the ED secondary to being unresponsive. She was referred to an allergist at [facility] with recommendation for not taking metronidazole unless under desensitization. She was once again seen in her primary care office during a routine physical exam and found to be positive for bacterial vaginosis and trichomonas.   She continues to have some dryness and itching with occasional discharge. No fevers chills, or sweats.   Allergies  Allergen Reactions  . Flagyl [Metronidazole] Anaphylaxis  . Codeine     Abdominal pain    Outpatient Medications Prior to Visit  Medication Sig Dispense Refill  . acyclovir (ZOVIRAX) 400 MG tablet TAKE 1 TABLET BY MOUTH THREE TIMES DAILY AS SOON AS SYMPTOMS START 15 tablet 4  . buPROPion (WELLBUTRIN SR) 150 MG 12 hr tablet 1 tab daily for 3-4 days, then increase to 1 tab BID 60 tablet 2  . EPINEPHrine 0.3 mg/0.3 mL IJ SOAJ injection INJ 1 SYRINGE IM ONCE UTD FOR 1 DOSE  0  . levothyroxine (SYNTHROID, LEVOTHROID) 88 MCG tablet Take 1 tablet (88 mcg total) by mouth daily before breakfast. 90 tablet 3  . nicotine (NICODERM CQ) 14 mg/24hr patch Place 1 patch (14 mg total) onto the skin daily. 28 patch 2   No facility-administered medications prior to visit.      Past Medical History:  Diagnosis Date  . Thyroid disease       Past Surgical History:  Procedure Laterality Date  . CESAREAN SECTION    . ROTATOR CUFF REPAIR Right 08/03/14  . TUBAL LIGATION       Review of Systems  Constitutional: Negative for chills and fever.  Respiratory: Negative for chest tightness and shortness of breath.   Cardiovascular: Negative for chest pain and leg swelling.  Genitourinary: Positive for vaginal discharge. Negative for dysuria, flank pain, frequency, genital sores, hematuria, urgency, vaginal bleeding and vaginal pain.      Objective:    BP 113/77   Pulse 62   Temp 97.8 F (36.6 C) (Oral)   Ht 5\' 2"  (1.575 m)   Wt 136 lb (61.7 kg)   LMP  (LMP Unknown)   BMI 24.87 kg/m  Nursing note and vital signs reviewed.  Physical Exam  Constitutional: She is oriented to person, place, and time. She appears well-developed and well-nourished. No distress.  Cardiovascular: Normal rate, regular rhythm, normal heart sounds and intact distal pulses.  Pulmonary/Chest: Effort normal and breath sounds normal.  Genitourinary:  Genitourinary Comments: Exam deferred.   Neurological: She is alert and oriented to person, place, and time.  Skin: Skin is warm and dry.  Psychiatric: She has a normal mood and affect.       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 We discussed the treatment options that are available including using boric acid suppositories or the desensitization protocol for metronidazole. At this time she wishes to pursue the boric acid suppositories which she will perform twice daily for 60 days. She has been instructed to abstain from intercourse during this time. Recommend follow up testing with primary care provider at the completion  of treatment to ensure resolution of infection. If remains positive, she will likely require metronidazole desensitization.       Relevant Medications   AMBULATORY NON FORMULARY MEDICATION       I am having Vicki Schultz start on AMBULATORY NON FORMULARY MEDICATION. I am also having her maintain her acyclovir, EPINEPHrine, buPROPion, nicotine, and levothyroxine.   Meds ordered this encounter  Medications  . AMBULATORY NON FORMULARY MEDICATION    Sig: Boric Acid 600 mg suppository. Insert 1 suppository vaginally two time daily.    Dispense:  60 suppository    Refill:  0    Order Specific Question:   Supervising Provider    Answer:   Judyann MunsonSNIDER, CYNTHIA [[id number]]     Follow-up: Return if symptoms worsen or fail to improve.   Marcos EkeGreg Atley Neubert, MSN, [facility]NP[facility]

## 2017-11-20 NOTE — Patient Instructions (Signed)
Nice to meet you.  We have provided a prescription for Boric Acid suppositories to be inserted twice daily vaginally.    No intercourse while taking this medication.   Your husband can pursue testing.  If this persists, the only other option would be metronidazole densensitization.

## 2017-11-26 ENCOUNTER — Ambulatory Visit: Payer: BLUE CROSS/BLUE SHIELD | Admitting: Family Medicine

## 2017-12-26 ENCOUNTER — Other Ambulatory Visit: Payer: Self-pay | Admitting: Family Medicine

## 2018-02-03 ENCOUNTER — Encounter: Payer: Self-pay | Admitting: Family Medicine

## 2018-02-03 ENCOUNTER — Other Ambulatory Visit: Payer: Self-pay

## 2018-02-03 ENCOUNTER — Ambulatory Visit: Payer: BLUE CROSS/BLUE SHIELD | Admitting: Family Medicine

## 2018-02-03 VITALS — BP 113/76 | HR 55 | Temp 98.8°F | Ht 62.0 in | Wt 135.5 lb

## 2018-02-03 DIAGNOSIS — Z23 Encounter for immunization: Secondary | ICD-10-CM

## 2018-02-03 DIAGNOSIS — N760 Acute vaginitis: Secondary | ICD-10-CM

## 2018-02-03 DIAGNOSIS — B9689 Other specified bacterial agents as the cause of diseases classified elsewhere: Secondary | ICD-10-CM

## 2018-02-03 DIAGNOSIS — A599 Trichomoniasis, unspecified: Secondary | ICD-10-CM | POA: Diagnosis not present

## 2018-02-03 LAB — WET PREP FOR TRICH, YEAST, CLUE
CLUE CELL EXAM: POSITIVE — AB
Trichomonas Exam: NEGATIVE
Yeast Exam: NEGATIVE

## 2018-02-03 MED ORDER — CLINDAMYCIN HCL 300 MG PO CAPS: 300.0000 mg | ORAL_CAPSULE | Freq: Two times a day (BID) | ORAL | 0 refills | Status: DC

## 2018-02-03 NOTE — Progress Notes (Signed)
BP 113/76   Pulse (!) 55   Temp 98.8 F (37.1 C) (Oral)   Ht 5\' 2"  (1.575 m)   Wt 135 lb 8 oz (61.5 kg)   LMP  (LMP Unknown)   SpO2 99%   BMI 24.78 kg/m    Subjective:    Patient ID: Vicki Schultz, female    DOB: [birth year], 47 y.o.   MRN: [id number]  HPI: Vicki Schultz is a 47 y.o. female  Chief Complaint  Patient presents with  . Trichomonas infection    f/u   VAGINAL DISCHARGE- known trichomonas. Saw ID and they treated her with boric acid due to anaphylaxis to flagyl. Here today for recheck Duration: chronic Discharge description: no  Pruritus: yes Dysuria: no Malodorous: no Urinary frequency: no Fevers: no Abdominal pain: no  Sexual activity: monogamous History of sexually transmitted diseases: yes Recent antibiotic use: no Context: Trichomonas  Treatments attempted: borax x30 days- didn't get the refill on the borax   Relevant past medical, surgical, family and social history reviewed and updated as indicated. Interim medical history since our last visit reviewed. Allergies and medications reviewed and updated.  Review of Systems  Constitutional: Negative.   Respiratory: Negative.   Cardiovascular: Negative.   Genitourinary: Negative for decreased urine volume, difficulty urinating, dyspareunia, dysuria, enuresis, flank pain, frequency, genital sores, hematuria, menstrual problem, pelvic pain, urgency, vaginal bleeding, vaginal discharge and vaginal pain.  Psychiatric/Behavioral: Negative.     Per HPI unless specifically indicated above     Objective:    BP 113/76   Pulse (!) 55   Temp 98.8 F (37.1 C) (Oral)   Ht 5\' 2"  (1.575 m)   Wt 135 lb 8 oz (61.5 kg)   LMP  (LMP Unknown)   SpO2 99%   BMI 24.78 kg/m   Wt Readings from Last 3 Encounters:  02/03/18 135 lb 8 oz (61.5 kg)  11/20/17 136 lb (61.7 kg)  10/24/17 135 lb 2 oz (61.3 kg)    Physical Exam  Constitutional: She is oriented to person, place, and time. She appears  well-developed and well-nourished. No distress.  HENT:  Head: Normocephalic and atraumatic.  Right Ear: Hearing normal.  Left Ear: Hearing normal.  Nose: Nose normal.  Eyes: Conjunctivae and lids are normal. Right eye exhibits no discharge. Left eye exhibits no discharge. No scleral icterus.  Pulmonary/Chest: Effort normal. No respiratory distress.  Abdominal: Hernia confirmed negative in the right inguinal area and confirmed negative in the left inguinal area.  Genitourinary: No labial fusion. There is no rash, tenderness, lesion or injury on the right labia. There is no rash, tenderness, lesion or injury on the left labia. Cervix exhibits no motion tenderness, no discharge and no friability. Right adnexum displays no mass, no tenderness and no fullness. Left adnexum displays no mass, no tenderness and no fullness. No erythema, tenderness or bleeding in the vagina. No foreign body in the vagina. No signs of injury around the vagina. Vaginal discharge found.  Musculoskeletal: Normal range of motion.  Neurological: She is alert and oriented to person, place, and time.  Skin: Skin is warm, dry and intact. Capillary refill takes less than 2 seconds. No rash noted. She is not diaphoretic. No erythema. No pallor.  Psychiatric: She has a normal mood and affect. Her speech is normal and behavior is normal. Judgment and thought content normal. Cognition and memory are normal.  Nursing note and vitals reviewed.   Results for orders placed or performed in visit  on 10/24/17  WET PREP FOR TRICH, YEAST, CLUE  Result Value Ref Range   Trichomonas Exam Positive (A) Negative   Yeast Exam Negative Negative   Clue Cell Exam Positive (A) Negative  Microscopic Examination  Result Value Ref Range   WBC, UA 0-5 0 - 5 /hpf   RBC, UA 0-2 0 - 2 /hpf   Epithelial Cells (non renal) 0-10 0 - 10 /hpf   Bacteria, UA Few None seen/Few   Trichomonas, UA Present None seen  CBC with Differential/Platelet  Result Value  Ref Range   WBC 10.4 3.4 - 10.8 x10E3/uL   RBC 4.53 3.77 - 5.28 x10E6/uL   Hemoglobin 14.7 11.1 - 15.9 g/dL   Hematocrit 78.2 95.6 - 46.6 %   MCV 94 79 - 97 fL   MCH 32.5 26.6 - 33.0 pg   MCHC 34.6 31.5 - 35.7 g/dL   RDW 21.3 08.6 - 57.8 %   Platelets 273 150 - 450 x10E3/uL   Neutrophils 48 Not Estab. %   Lymphs 40 Not Estab. %   Monocytes 6 Not Estab. %   Eos 5 Not Estab. %   Basos 1 Not Estab. %   Neutrophils Absolute 5.0 1.4 - 7.0 x10E3/uL   Lymphocytes Absolute 4.2 (H) 0.7 - 3.1 x10E3/uL   Monocytes Absolute 0.6 0.1 - 0.9 x10E3/uL   EOS (ABSOLUTE) 0.5 (H) 0.0 - 0.4 x10E3/uL   Basophils Absolute 0.1 0.0 - 0.2 x10E3/uL   Immature Granulocytes 0 Not Estab. %   Immature Grans (Abs) 0.0 0.0 - 0.1 x10E3/uL  Comprehensive metabolic panel  Result Value Ref Range   Glucose 81 65 - 99 mg/dL   BUN 11 6 - 24 mg/dL   Creatinine, Ser 4.69 0.57 - 1.00 mg/dL   GFR calc non Af Amer 73 >59 mL/min/1.73   GFR calc Af Amer 84 >59 mL/min/1.73   BUN/Creatinine Ratio 12 9 - 23   Sodium 140 134 - 144 mmol/L   Potassium 3.8 3.5 - 5.2 mmol/L   Chloride 99 96 - 106 mmol/L   CO2 25 20 - 29 mmol/L   Calcium 9.8 8.7 - 10.2 mg/dL   Total Protein 7.5 6.0 - 8.5 g/dL   Albumin 4.6 3.5 - 5.5 g/dL   Globulin, Total 2.9 1.5 - 4.5 g/dL   Albumin/Globulin Ratio 1.6 1.2 - 2.2   Bilirubin Total 0.3 0.0 - 1.2 mg/dL   Alkaline Phosphatase 93 39 - 117 IU/L   AST 20 0 - 40 IU/L   ALT 24 0 - 32 IU/L  Lipid Panel w/o Chol/HDL Ratio  Result Value Ref Range   Cholesterol, Total 257 (H) 100 - 199 mg/dL   Triglycerides 629 (H) 0 - 149 mg/dL   HDL 34 (L) >52 mg/dL   VLDL Cholesterol Cal Comment 5 - 40 mg/dL   LDL Calculated Comment 0 - 99 mg/dL  TSH  Result Value Ref Range   TSH 0.929 0.450 - 4.500 uIU/mL  UA/M w/rflx Culture, Routine  Result Value Ref Range   Specific Gravity, UA <1.005 (L) 1.005 - 1.030   pH, UA 5.0 5.0 - 7.5   Color, UA Yellow Yellow   Appearance Ur Clear Clear   Leukocytes, UA Trace  (A) Negative   Protein, UA Negative Negative/Trace   Glucose, UA Negative Negative   Ketones, UA Negative Negative   RBC, UA Negative Negative   Bilirubin, UA Negative Negative   Urobilinogen, Ur 0.2 0.2 - 1.0 mg/dL   Nitrite, UA Negative  Negative   Microscopic Examination See below:   HM PAP SMEAR  Result Value Ref Range   HM Pap smear Negative with Neg HPV       Assessment & Plan:   Problem List Items Addressed This Visit      Other   Trichomonas infection - Primary    Anaphylactic to flagyl. Has completed 30 day course of borax suppositories, but was supposed to have 60 days- didn't get Rx from prescribing physician. On wet prep today, Ivery Qualetrich is gone!       Relevant Medications   clindamycin (CLEOCIN) 300 MG capsule   Other Relevant Orders   WET PREP FOR TRICH, YEAST, CLUE    Other Visit Diagnoses    Flu vaccine need       Flu shot given today.   Relevant Orders   Flu Vaccine QUAD 36+ mos IM   BV (bacterial vaginosis)       Will treat with clindamycin. Call with any concerns or if not getting better.    Relevant Medications   clindamycin (CLEOCIN) 300 MG capsule       Follow up plan: Return June, for Physical.

## 2018-02-03 NOTE — Assessment & Plan Note (Addendum)
Anaphylactic to flagyl. Has completed 30 day course of borax suppositories, but was supposed to have 60 days- didn't get Rx from prescribing physician. On wet prep today, Vicki Schultz is gone!

## 2018-02-24 ENCOUNTER — Encounter: Payer: Self-pay | Admitting: Family Medicine

## 2018-03-25 ENCOUNTER — Other Ambulatory Visit (HOSPITAL_COMMUNITY)
Admission: RE | Admit: 2018-03-25 | Discharge: 2018-03-25 | Disposition: A | Discharge status: Home / Self Care | Payer: BLUE CROSS/BLUE SHIELD | Source: Ambulatory Visit | Attending: Family Medicine | Admitting: Family Medicine

## 2018-03-25 ENCOUNTER — Encounter: Payer: Self-pay | Admitting: Family Medicine

## 2018-03-25 ENCOUNTER — Ambulatory Visit: Payer: BLUE CROSS/BLUE SHIELD | Admitting: Family Medicine

## 2018-03-25 VITALS — BP 109/74 | HR 59 | Temp 98.2°F | Ht 62.0 in | Wt 135.2 lb

## 2018-03-25 DIAGNOSIS — R413 Other amnesia: Secondary | ICD-10-CM | POA: Diagnosis not present

## 2018-03-25 DIAGNOSIS — M545 Low back pain, unspecified: Secondary | ICD-10-CM

## 2018-03-25 DIAGNOSIS — Z01419 Encounter for gynecological examination (general) (routine) without abnormal findings: Secondary | ICD-10-CM | POA: Diagnosis not present

## 2018-03-25 DIAGNOSIS — Z124 Encounter for screening for malignant neoplasm of cervix: Secondary | ICD-10-CM | POA: Insufficient documentation

## 2018-03-25 DIAGNOSIS — G8929 Other chronic pain: Secondary | ICD-10-CM

## 2018-03-25 DIAGNOSIS — R21 Rash and other nonspecific skin eruption: Secondary | ICD-10-CM

## 2018-03-25 NOTE — Patient Instructions (Signed)

## 2018-03-25 NOTE — Progress Notes (Signed)
BP 109/74 (BP Location: Left Arm, Patient Position: Sitting, Cuff Size: Normal)   Pulse (!) 59   Temp 98.2 F (36.8 C)   Ht 5\' 2"  (1.575 m)   Wt 135 lb 3 oz (61.3 kg)   LMP  (LMP Unknown)   SpO2 100%   BMI 24.73 kg/m    Subjective:    Patient ID: Vicki Schultz, female    DOB: 1970/05/24, 47 y.o.   MRN: [id number]  HPI: Vicki Schultz is a 47 y.o. female  Chief Complaint  Patient presents with  . Gynecologic Exam  . SEXUALLY TRANSMITTED DISEASE    Patient would like to be checked for genital herpes, refill on medication   Here today for her pap, was on her menses at physical, so didn't do it at that time. No issues.   Having some issues with her memory- had 1 day where she forgot how to button her shirt. She notes that she is forgetting things. Forgot her address. Seems to be getting worse.  BACK PAIN Duration: weeks Mechanism of injury: unknown Location: bilateral and low back Onset: gradual Severity: moderate Quality: aching and sore Frequency: worst when she first  Radiation: none Aggravating factors: when she just wakes up Alleviating factors: moving Status: stable Treatments attempted: ibuprofen, aleve and physical therapy  Relief with NSAIDs?: mild Nighttime pain:  no Paresthesias / decreased sensation:  no Bowel / bladder incontinence:  no Fevers:  no Dysuria / urinary frequency:  no  Relevant past medical, surgical, family and social history reviewed and updated as indicated. Interim medical history since our last visit reviewed. Allergies and medications reviewed and updated.  Review of Systems  Constitutional: Negative.   Respiratory: Negative.   Cardiovascular: Negative.   Gastrointestinal: Negative.   Musculoskeletal: Positive for back pain. Negative for arthralgias, gait problem, joint swelling, myalgias, neck pain and neck stiffness.  Skin: Negative.   Neurological: Negative.   Hematological: Negative.   Psychiatric/Behavioral:  Negative for agitation, behavioral problems, confusion, decreased concentration, dysphoric mood, hallucinations, self-injury, sleep disturbance and suicidal ideas. The patient is not nervous/anxious and is not hyperactive.        Memory loss     Per HPI unless specifically indicated above     Objective:    BP 109/74 (BP Location: Left Arm, Patient Position: Sitting, Cuff Size: Normal)   Pulse (!) 59   Temp 98.2 F (36.8 C)   Ht 5\' 2"  (1.575 m)   Wt 135 lb 3 oz (61.3 kg)   LMP  (LMP Unknown)   SpO2 100%   BMI 24.73 kg/m   Wt Readings from Last 3 Encounters:  03/25/18 135 lb 3 oz (61.3 kg)  02/03/18 135 lb 8 oz (61.5 kg)  11/20/17 136 lb (61.7 kg)    Physical Exam  Constitutional: She is oriented to person, place, and time. She appears well-developed and well-nourished. No distress.  HENT:  Head: Normocephalic and atraumatic.  Right Ear: Hearing normal.  Left Ear: Hearing normal.  Nose: Nose normal.  Eyes: Conjunctivae and lids are normal. Right eye exhibits no discharge. Left eye exhibits no discharge. No scleral icterus.  Cardiovascular: Normal rate, regular rhythm, normal heart sounds and intact distal pulses. Exam reveals no gallop and no friction rub.  No murmur heard. Pulmonary/Chest: Effort normal and breath sounds normal. No stridor. No respiratory distress. She has no wheezes. She has no rales. She exhibits no tenderness.  Abdominal: Soft. Bowel sounds are normal. She exhibits no distension and  no mass. There is no tenderness. There is no rebound and no guarding. No hernia. Hernia confirmed negative in the right inguinal area and confirmed negative in the left inguinal area.  Genitourinary: Vagina normal and uterus normal. No labial fusion. There is no rash, tenderness, lesion or injury on the right labia. There is no rash, tenderness, lesion or injury on the left labia. Uterus is not deviated, not enlarged, not fixed and not tender. Cervix exhibits no motion tenderness,  no discharge and no friability. Right adnexum displays no mass, no tenderness and no fullness. Left adnexum displays no mass, no tenderness and no fullness. No erythema, tenderness or bleeding in the vagina. No foreign body in the vagina. No signs of injury around the vagina. No vaginal discharge found.  Musculoskeletal: Normal range of motion. She exhibits no edema, tenderness or deformity.  Neurological: She is alert and oriented to person, place, and time. She displays normal reflexes. No cranial nerve deficit or sensory deficit. She exhibits normal muscle tone. Coordination normal.  Skin: Skin is warm, dry and intact. Capillary refill takes less than 2 seconds. No rash noted. She is not diaphoretic. No erythema. No pallor.  Psychiatric: She has a normal mood and affect. Her speech is normal and behavior is normal. Judgment and thought content normal. Cognition and memory are normal.  Nursing note and vitals reviewed.   Results for orders placed or performed in visit on 03/25/18  HSV(herpes simplex vrs) 1+2 ab-IgG  Result Value Ref Range   HSV 1 Glycoprotein G Ab, IgG 11.80 (H) 0.00 - 0.90 index   HSV 2 IgG, Type Spec 1.33 (H) 0.00 - 0.90 index  CBC with Differential/Platelet  Result Value Ref Range   WBC 5.8 3.4 - 10.8 x10E3/uL   RBC 4.36 3.77 - 5.28 x10E6/uL   Hemoglobin 13.5 11.1 - 15.9 g/dL   Hematocrit 40.9 81.1 - 46.6 %   MCV 92 79 - 97 fL   MCH 31.0 26.6 - 33.0 pg   MCHC 33.7 31.5 - 35.7 g/dL   RDW 91.4 78.2 - 95.6 %   Platelets 259 150 - 450 x10E3/uL   Neutrophils 39 Not Estab. %   Lymphs 51 Not Estab. %   Monocytes 8 Not Estab. %   Eos 1 Not Estab. %   Basos 1 Not Estab. %   Neutrophils Absolute 2.2 1.4 - 7.0 x10E3/uL   Lymphocytes Absolute 2.9 0.7 - 3.1 x10E3/uL   Monocytes Absolute 0.5 0.1 - 0.9 x10E3/uL   EOS (ABSOLUTE) 0.1 0.0 - 0.4 x10E3/uL   Basophils Absolute 0.1 0.0 - 0.2 x10E3/uL   Immature Granulocytes 0 Not Estab. %   Immature Grans (Abs) 0.0 0.0 - 0.1  x10E3/uL  Comprehensive metabolic panel  Result Value Ref Range   Glucose 86 65 - 99 mg/dL   BUN 13 6 - 24 mg/dL   Creatinine, Ser 2.13 0.57 - 1.00 mg/dL   GFR calc non Af Amer 71 >59 mL/min/1.73   GFR calc Af Amer 81 >59 mL/min/1.73   BUN/Creatinine Ratio 14 9 - 23   Sodium 141 134 - 144 mmol/L   Potassium 4.0 3.5 - 5.2 mmol/L   Chloride 102 96 - 106 mmol/L   CO2 24 20 - 29 mmol/L   Calcium 9.6 8.7 - 10.2 mg/dL   Total Protein 6.9 6.0 - 8.5 g/dL   Albumin 4.5 3.5 - 5.5 g/dL   Globulin, Total 2.4 1.5 - 4.5 g/dL   Albumin/Globulin Ratio 1.9 1.2 - 2.2  Bilirubin Total 0.2 0.0 - 1.2 mg/dL   Alkaline Phosphatase 83 39 - 117 IU/L   AST 19 0 - 40 IU/L   ALT 19 0 - 32 IU/L  Lipid Panel w/o Chol/HDL Ratio  Result Value Ref Range   Cholesterol, Total 224 (H) 100 - 199 mg/dL   Triglycerides 161190 (H) 0 - 149 mg/dL   HDL 37 (L) >09>39 mg/dL   VLDL Cholesterol Cal 38 5 - 40 mg/dL   LDL Calculated 604149 (H) 0 - 99 mg/dL  TSH  Result Value Ref Range   TSH 2.140 0.450 - 4.500 uIU/mL  VITAMIN D 25 Hydroxy (Vit-D Deficiency, Fractures)  Result Value Ref Range   Vit D, 25-Hydroxy 21.7 (L) 30.0 - 100.0 ng/mL  B12 and Folate Panel  Result Value Ref Range   Vitamin B-12 306 232 - 1,245 pg/mL   Folate 6.3 >3.0 ng/mL  Vitamin B1  Result Value Ref Range   Thiamine WILL FOLLOW   HSV-2 IgG Supplemental Test  Result Value Ref Range   HSV-2 IgG Supplemental Test Positive (A) Negative  Cytology - PAP  Result Value Ref Range   Adequacy      Satisfactory for evaluation  endocervical/transformation zone component PRESENT.   Diagnosis      NEGATIVE FOR INTRAEPITHELIAL LESIONS OR MALIGNANCY.   HPV NOT DETECTED    Material Submitted CervicoVaginal Pap [ThinPrep Imaged]       Assessment & Plan:   Problem List Items Addressed This Visit      Other   Memory loss    Of unclear etiology. Will check labs and see how they are doing. If normal, will refer to neurology for evaluation.       Relevant  Orders   HSV(herpes simplex vrs) 1+2 ab-IgG (Completed)   CBC with Differential/Platelet (Completed)   Comprehensive metabolic panel (Completed)   Lipid Panel w/o Chol/HDL Ratio (Completed)   TSH (Completed)   VITAMIN D 25 Hydroxy (Vit-D Deficiency, Fractures) (Completed)   B12 and Folate Panel (Completed)   Vitamin B1 (Completed)   Ambulatory referral to Neurology    Other Visit Diagnoses    Well woman exam    -  Primary   Pap done today. Call with any concerns. Await results.    Screening for cervical cancer       Pap done today. Call with any concerns. Await results.    Relevant Orders   Cytology - PAP (Completed)   Chronic bilateral low back pain without sciatica       Will start doing exercises before she gets up. Lidocaine patches before bed. Continue aleve PRN. Call with any concerns or if she wants PT.       Follow up plan: Return if symptoms worsen or fail to improve.

## 2018-03-26 ENCOUNTER — Other Ambulatory Visit: Payer: Self-pay | Admitting: Family Medicine

## 2018-03-26 LAB — CYTOLOGY - PAP
Diagnosis: NEGATIVE
HPV (WINDOPATH): NOT DETECTED

## 2018-03-27 ENCOUNTER — Encounter: Payer: Self-pay | Admitting: Family Medicine

## 2018-03-27 ENCOUNTER — Telehealth: Payer: Self-pay | Admitting: Family Medicine

## 2018-03-27 DIAGNOSIS — R413 Other amnesia: Secondary | ICD-10-CM | POA: Insufficient documentation

## 2018-03-27 NOTE — Telephone Encounter (Signed)
Please let her know that she has been exposed to the genital herpes virus- which might be causing her rash. Her acyclovir should help with her symptoms and make her feel better. The rest of her labs came back normal. Her pap was also normal so she's good for 5 years. Thanks!

## 2018-03-27 NOTE — Telephone Encounter (Signed)
Patient notified of results. Patient asked about B12 and I told the patient that it was within normal range. Patient wants to know what is next about her memory since her B12 is normal.

## 2018-03-27 NOTE — Assessment & Plan Note (Signed)
Of unclear etiology. Will check labs and see how they are doing. If normal, will refer to neurology for evaluation.

## 2018-03-28 NOTE — Telephone Encounter (Signed)
I've put in a referral for her to see neurology- they should be giving her a call.

## 2018-03-29 LAB — COMPREHENSIVE METABOLIC PANEL
ALT: 19 IU/L (ref 0–32)
AST: 19 IU/L (ref 0–40)
Albumin/Globulin Ratio: 1.9 (ref 1.2–2.2)
Albumin: 4.5 g/dL (ref 3.5–5.5)
Alkaline Phosphatase: 83 IU/L (ref 39–117)
BUN/Creatinine Ratio: 14 (ref 9–23)
BUN: 13 mg/dL (ref 6–24)
Bilirubin Total: 0.2 mg/dL (ref 0.0–1.2)
CO2: 24 mmol/L (ref 20–29)
Calcium: 9.6 mg/dL (ref 8.7–10.2)
Chloride: 102 mmol/L (ref 96–106)
Creatinine, Ser: 0.95 mg/dL (ref 0.57–1.00)
GFR calc Af Amer: 81 mL/min/{1.73_m2} (ref >59)
GFR calc non Af Amer: 71 mL/min/{1.73_m2} (ref >59)
Globulin, Total: 2.4 g/dL (ref 1.5–4.5)
Glucose: 86 mg/dL (ref 65–99)
Potassium: 4 mmol/L (ref 3.5–5.2)
Sodium: 141 mmol/L (ref 134–144)
Total Protein: 6.9 g/dL (ref 6.0–8.5)

## 2018-03-29 LAB — CBC WITH DIFFERENTIAL/PLATELET
Basophils Absolute: 0.1 10*3/uL (ref 0.0–0.2)
Basos: 1 %
EOS (ABSOLUTE): 0.1 10*3/uL (ref 0.0–0.4)
EOS: 1 %
HEMATOCRIT: 40.1 % (ref 34.0–46.6)
HEMOGLOBIN: 13.5 g/dL (ref 11.1–15.9)
Immature Grans (Abs): 0 10*3/uL (ref 0.0–0.1)
Immature Granulocytes: 0 %
LYMPHS ABS: 2.9 10*3/uL (ref 0.7–3.1)
Lymphs: 51 %
MCH: 31 pg (ref 26.6–33.0)
MCHC: 33.7 g/dL (ref 31.5–35.7)
MCV: 92 fL (ref 79–97)
MONOCYTES: 8 %
MONOS ABS: 0.5 10*3/uL (ref 0.1–0.9)
NEUTROS ABS: 2.2 10*3/uL (ref 1.4–7.0)
Neutrophils: 39 %
Platelets: 259 10*3/uL (ref 150–450)
RBC: 4.36 x10E6/uL (ref 3.77–5.28)
RDW: 12.3 % (ref 12.3–15.4)
WBC: 5.8 10*3/uL (ref 3.4–10.8)

## 2018-03-29 LAB — LIPID PANEL W/O CHOL/HDL RATIO
CHOLESTEROL TOTAL: 224 mg/dL — AB (ref 100–199)
HDL: 37 mg/dL — ABNORMAL LOW (ref >39)
LDL Calculated: 149 mg/dL — ABNORMAL HIGH (ref 0–99)
Triglycerides: 190 mg/dL — ABNORMAL HIGH (ref 0–149)
VLDL Cholesterol Cal: 38 mg/dL (ref 5–40)

## 2018-03-29 LAB — TSH: TSH: 2.14 u[IU]/mL (ref 0.450–4.500)

## 2018-03-29 LAB — HSV-2 IGG SUPPLEMENTAL TEST: HSV-2 IGG SUPPLEMENTAL TEST: POSITIVE — AB

## 2018-03-29 LAB — VITAMIN D 25 HYDROXY (VIT D DEFICIENCY, FRACTURES): Vit D, 25-Hydroxy: 21.7 ng/mL — ABNORMAL LOW (ref 30.0–100.0)

## 2018-03-29 LAB — B12 AND FOLATE PANEL
Folate: 6.3 ng/mL (ref >3.0)
Vitamin B-12: 306 pg/mL (ref 232–1245)

## 2018-03-29 LAB — HSV(HERPES SIMPLEX VRS) I + II AB-IGG
HSV 1 Glycoprotein G Ab, IgG: 11.8 index — ABNORMAL HIGH (ref 0.00–0.90)
HSV 2 IgG, Type Spec: 1.33 index — ABNORMAL HIGH (ref 0.00–0.90)

## 2018-03-29 LAB — VITAMIN B1: Thiamine: 98.2 nmol/L (ref 66.5–200.0)

## 2018-03-30 NOTE — Telephone Encounter (Signed)
LVM for patient on referral to Neurologist.  DPR reviewed.

## 2018-04-20 ENCOUNTER — Ambulatory Visit: Payer: BLUE CROSS/BLUE SHIELD | Admitting: Family Medicine

## 2018-05-27 ENCOUNTER — Other Ambulatory Visit: Payer: Self-pay | Admitting: Family Medicine

## 2018-10-23 ENCOUNTER — Other Ambulatory Visit: Payer: Self-pay

## 2018-10-23 ENCOUNTER — Encounter: Payer: Self-pay | Admitting: Family Medicine

## 2018-10-23 ENCOUNTER — Ambulatory Visit (INDEPENDENT_AMBULATORY_CARE_PROVIDER_SITE_OTHER): Payer: Self-pay | Admitting: Family Medicine

## 2018-10-23 VITALS — BP 93/65 | HR 58 | Temp 98.6°F | Ht 62.0 in | Wt 149.0 lb

## 2018-10-23 DIAGNOSIS — M47816 Spondylosis without myelopathy or radiculopathy, lumbar region: Secondary | ICD-10-CM | POA: Insufficient documentation

## 2018-10-23 DIAGNOSIS — S32009A Unspecified fracture of unspecified lumbar vertebra, initial encounter for closed fracture: Secondary | ICD-10-CM

## 2018-10-23 MED ORDER — HYDROCODONE-ACETAMINOPHEN 10-325 MG PO TABS: 1.0000 | ORAL_TABLET | Freq: Three times a day (TID) | ORAL | 0 refills | Status: AC | PRN

## 2018-10-23 NOTE — Progress Notes (Signed)
BP 93/65   Pulse (!) 58   Temp 98.6 F (37 C) (Oral)   Ht 5\' 2"  (1.575 m)   Wt 149 lb (67.6 kg)   LMP  (LMP Unknown)   SpO2 97%   BMI 27.25 kg/m    Subjective:    Patient ID: Vicki Schultz, female    DOB: [birth year], 48 y.o.   MRN: [id number]  HPI: Vicki Schultz is a 48 y.o. female  Chief Complaint  Patient presents with  . Hospitalization Follow-up    fell in the bathroom at home. pt have not got the lidocaine patch from the pharmacy yet  . Pain    right buttock/hip   Fell in the bathroom and rolled her ankle and went DOWN. Fell onto her bathtub and ended up breaking her transverse processes of L2-3. Didn't hit her head. No LOC.   ER FOLLOW UP Time since discharge: 2 days Hospital/facility: [facility] Diagnosis: Acute minimally displaced right L2 and L3 transverse process fractures.  Procedures/tests: CT spine and x-ray back Consultants: Neurosurgery (curbside) New medications: lidocaine patch, motrin and tylenol Discharge instructions:  Follow up here Status: stable  Relevant past medical, surgical, family and social history reviewed and updated as indicated. Interim medical history since our last visit reviewed. Allergies and medications reviewed and updated.  Review of Systems  Constitutional: Negative.   Respiratory: Negative.   Cardiovascular: Negative.   Gastrointestinal: Negative.   Musculoskeletal: Positive for back pain and myalgias. Negative for arthralgias, gait problem, joint swelling, neck pain and neck stiffness.  Skin: Negative.   Neurological: Negative.   Psychiatric/Behavioral: Negative.     Per HPI unless specifically indicated above     Objective:    BP 93/65   Pulse (!) 58   Temp 98.6 F (37 C) (Oral)   Ht 5\' 2"  (1.575 m)   Wt 149 lb (67.6 kg)   LMP  (LMP Unknown)   SpO2 97%   BMI 27.25 kg/m   Wt Readings from Last 3 Encounters:  10/23/18 149 lb (67.6 kg)  03/25/18 135 lb 3 oz (61.3 kg)  02/03/18 135 lb 8  oz (61.5 kg)    Physical Exam Vitals signs and nursing note reviewed.  Constitutional:      General: She is not in acute distress.    Appearance: Normal appearance. She is not ill-appearing, toxic-appearing or diaphoretic.     Comments: visibly uncomfortable  HENT:     Head: Normocephalic and atraumatic.     Right Ear: External ear normal.     Left Ear: External ear normal.     Nose: Nose normal.     Mouth/Throat:     Mouth: Mucous membranes are moist.     Pharynx: Oropharynx is clear.  Eyes:     General: No scleral icterus.       Right eye: No discharge.        Left eye: No discharge.     Extraocular Movements: Extraocular movements intact.     Conjunctiva/sclera: Conjunctivae normal.     Pupils: Pupils are equal, round, and reactive to light.  Neck:     Musculoskeletal: Normal range of motion and neck supple.  Cardiovascular:     Rate and Rhythm: Normal rate and regular rhythm.     Pulses: Normal pulses.     Heart sounds: Normal heart sounds. No murmur. No friction rub. No gallop.   Pulmonary:     Effort: Pulmonary effort is normal. No respiratory distress.  Breath sounds: Normal breath sounds. No stridor. No wheezing, rhonchi or rales.  Chest:     Chest wall: No tenderness.  Musculoskeletal: Normal range of motion.  Skin:    General: Skin is warm and dry.     Capillary Refill: Capillary refill takes less than 2 seconds.     Coloration: Skin is not jaundiced or pale.     Findings: No bruising, erythema, lesion or rash.  Neurological:     General: No focal deficit present.     Mental Status: She is alert and oriented to person, place, and time. Mental status is at baseline.  Psychiatric:        Mood and Affect: Mood normal.        Behavior: Behavior normal.        Thought Content: Thought content normal.        Judgment: Judgment normal.     Results for orders placed or performed in visit on 03/25/18  HSV(herpes simplex vrs) 1+2 ab-IgG  Result Value Ref Range    HSV 1 Glycoprotein G Ab, IgG 11.80 (H) 0.00 - 0.90 index   HSV 2 IgG, Type Spec 1.33 (H) 0.00 - 0.90 index  CBC with Differential/Platelet  Result Value Ref Range   WBC 5.8 3.4 - 10.8 x10E3/uL   RBC 4.36 3.77 - 5.28 x10E6/uL   Hemoglobin 13.5 11.1 - 15.9 g/dL   Hematocrit 14.740.1 82.934.0 - 46.6 %   MCV 92 79 - 97 fL   MCH 31.0 26.6 - 33.0 pg   MCHC 33.7 31.5 - 35.7 g/dL   RDW 56.212.3 13.012.3 - 86.515.4 %   Platelets 259 150 - 450 x10E3/uL   Neutrophils 39 Not Estab. %   Lymphs 51 Not Estab. %   Monocytes 8 Not Estab. %   Eos 1 Not Estab. %   Basos 1 Not Estab. %   Neutrophils Absolute 2.2 1.4 - 7.0 x10E3/uL   Lymphocytes Absolute 2.9 0.7 - 3.1 x10E3/uL   Monocytes Absolute 0.5 0.1 - 0.9 x10E3/uL   EOS (ABSOLUTE) 0.1 0.0 - 0.4 x10E3/uL   Basophils Absolute 0.1 0.0 - 0.2 x10E3/uL   Immature Granulocytes 0 Not Estab. %   Immature Grans (Abs) 0.0 0.0 - 0.1 x10E3/uL  Comprehensive metabolic panel  Result Value Ref Range   Glucose 86 65 - 99 mg/dL   BUN 13 6 - 24 mg/dL   Creatinine, Ser 7.840.95 0.57 - 1.00 mg/dL   GFR calc non Af Amer 71 >59 mL/min/1.73   GFR calc Af Amer 81 >59 mL/min/1.73   BUN/Creatinine Ratio 14 9 - 23   Sodium 141 134 - 144 mmol/L   Potassium 4.0 3.5 - 5.2 mmol/L   Chloride 102 96 - 106 mmol/L   CO2 24 20 - 29 mmol/L   Calcium 9.6 8.7 - 10.2 mg/dL   Total Protein 6.9 6.0 - 8.5 g/dL   Albumin 4.5 3.5 - 5.5 g/dL   Globulin, Total 2.4 1.5 - 4.5 g/dL   Albumin/Globulin Ratio 1.9 1.2 - 2.2   Bilirubin Total 0.2 0.0 - 1.2 mg/dL   Alkaline Phosphatase 83 39 - 117 IU/L   AST 19 0 - 40 IU/L   ALT 19 0 - 32 IU/L  Lipid Panel w/o Chol/HDL Ratio  Result Value Ref Range   Cholesterol, Total 224 (H) 100 - 199 mg/dL   Triglycerides 696190 (H) 0 - 149 mg/dL   HDL 37 (L) >29>39 mg/dL   VLDL Cholesterol Cal 38 5 - 40 mg/dL  LDL Calculated 149 (H) 0 - 99 mg/dL  TSH  Result Value Ref Range   TSH 2.140 0.450 - 4.500 uIU/mL  VITAMIN D 25 Hydroxy (Vit-D Deficiency, Fractures)  Result  Value Ref Range   Vit D, 25-Hydroxy 21.7 (L) 30.0 - 100.0 ng/mL  B12 and Folate Panel  Result Value Ref Range   Vitamin B-12 306 232 - 1,245 pg/mL   Folate 6.3 >3.0 ng/mL  Vitamin B1  Result Value Ref Range   Thiamine 98.2 66.5 - 200.0 nmol/L  HSV-2 IgG Supplemental Test  Result Value Ref Range   HSV-2 IgG Supplemental Test Positive (A) Negative  Cytology - PAP  Result Value Ref Range   Adequacy      Satisfactory for evaluation  endocervical/transformation zone component PRESENT.   Diagnosis      NEGATIVE FOR INTRAEPITHELIAL LESIONS OR MALIGNANCY.   HPV NOT DETECTED    Material Submitted CervicoVaginal Pap [ThinPrep Imaged]       Assessment & Plan:   Problem List Items Addressed This Visit      Musculoskeletal and Integument   Degenerative joint disease (DJD) of lumbar spine    Found incidentally on CT. Continue to monitor. Call with any concerns.       Relevant Medications   HYDROcodone-acetaminophen (NORCO) 10-325 MG tablet    Other Visit Diagnoses    Closed fracture of transverse process of lumbar vertebra, initial encounter (HCC)    -  Primary   In significant pain- will start norco and continue to monitor. Rest. Will get DEXA given fracture. Await results. Call with any concerns.    Relevant Orders   DG Bone Density       Follow up plan: Return in about 2 weeks (around 11/06/2018).

## 2018-10-25 ENCOUNTER — Encounter: Payer: Self-pay | Admitting: Family Medicine

## 2018-10-25 NOTE — Assessment & Plan Note (Signed)
Found incidentally on CT. Continue to monitor. Call with any concerns.

## 2018-10-27 ENCOUNTER — Encounter: Payer: Self-pay | Admitting: Family Medicine

## 2018-10-28 ENCOUNTER — Telehealth: Payer: Self-pay | Admitting: Family Medicine

## 2018-10-28 NOTE — Telephone Encounter (Signed)
Note in chart. Sent to her by mychart yesterday. Please make sure she got it.

## 2018-10-28 NOTE — Telephone Encounter (Signed)
Patient has note.

## 2018-10-28 NOTE — Telephone Encounter (Signed)
Copied from [place] [phone]. Topic: General - Call Back - No Documentation >> Oct 27, 2018  9:52 AM Erick Blinks wrote: Reason for CRM: Pt requesting out of work note for more time. Please advise

## 2018-10-29 ENCOUNTER — Other Ambulatory Visit: Payer: Self-pay | Admitting: Family Medicine

## 2018-10-29 NOTE — Telephone Encounter (Signed)
Forwarding medication refill to PCP for review. 

## 2018-10-30 ENCOUNTER — Other Ambulatory Visit: Payer: Self-pay | Admitting: Family Medicine

## 2018-10-30 NOTE — Telephone Encounter (Signed)
Routing to provider  

## 2018-11-02 NOTE — Telephone Encounter (Signed)
Called and left detailed message with information on patients vm. DPR was checked.   

## 2018-11-02 NOTE — Telephone Encounter (Signed)
Please set up phone visit for refills

## 2018-11-06 ENCOUNTER — Encounter: Payer: Self-pay | Admitting: Nurse Practitioner

## 2018-11-06 ENCOUNTER — Ambulatory Visit: Payer: Self-pay

## 2018-11-06 ENCOUNTER — Other Ambulatory Visit: Payer: Self-pay

## 2018-11-06 ENCOUNTER — Other Ambulatory Visit: Payer: BLUE CROSS/BLUE SHIELD

## 2018-11-06 ENCOUNTER — Ambulatory Visit (INDEPENDENT_AMBULATORY_CARE_PROVIDER_SITE_OTHER): Payer: Self-pay | Admitting: Nurse Practitioner

## 2018-11-06 ENCOUNTER — Telehealth: Payer: Self-pay | Admitting: *Deleted

## 2018-11-06 DIAGNOSIS — Z20822 Contact with and (suspected) exposure to covid-19: Secondary | ICD-10-CM

## 2018-11-06 DIAGNOSIS — R197 Diarrhea, unspecified: Secondary | ICD-10-CM

## 2018-11-06 MED ORDER — ONDANSETRON HCL 4 MG PO TABS: 4.0000 mg | ORAL_TABLET | Freq: Three times a day (TID) | ORAL | 0 refills | Status: DC | PRN

## 2018-11-06 NOTE — Patient Instructions (Signed)
This information is directly available on the CDC website: https://www.cdc.gov/coronavirus/2019-ncov/if-you-are-sick/steps-when-sick.html    Source:CDC Reference to specific commercial products, manufacturers, companies, or trademarks does not constitute its endorsement or recommendation by the U.S. Government, Department of Health and Human Services, or Centers for Disease Control and Prevention.  

## 2018-11-06 NOTE — Telephone Encounter (Signed)
-----   Message from Venita Lick, NP sent at 11/06/2018 11:23 AM EDT ----- Name: Vicki Schultz  DOB: 1971-05-10  MRN: [id number]  Insuarance: Self pay   Reason:  Patient with symptoms to include chills, GI issues, sore throat, headache.  Risk factors include obesity.   Had recent ER visit for back two weeks ago.

## 2018-11-06 NOTE — Telephone Encounter (Signed)
Incoming call from Pt.  Reporting various Sx. Vomiting 20 times, onset Wednesday.  Had two diarrhea stools today. Does not have a thermometer.   Mouth feels dry.  Reports slight headache,  Denies dizziness. Reports a cough.  . Rated 10 Unknown fever Patient actually had a virtual appoint So [facility]. Office states they will contact Pt. With Covid-19 results.  Pt. Voices understanding.           1  Vicki Schultz Female, 48 y.o., December 03, 1970 MRN:  [id number] Phone:  [phone] (M) ... PCP:  Vicki CarrowJohnson, Megan P, DO Coverage:  [employer] Other Next Appt With Family Medicine 11/09/2018 at 4:00 PM Message from Elliot Gaultiffany M Bell sent at 11/06/2018 10:24 AM EDT  Patient experiencing vomiting, chills, cough, unsure of fever for 2x days. Patient already was scheduled with PCP for Monday 11/09/2018 due to bone fracture. Patient seeking clinical advice regarding symptoms, please advise   Call History   Type Contact  11/06/2018 10:24 AM EDT Phone (Incoming) Vicki Schultz, Vicki D (Self)  Phone: [phone] (H)  User: Elliot GaultBell, Tiffany M    Reason for Disposition . Cough  Answer Assessment - Initial Assessment Questions 1. VOMITING SEVERITY: "How many times have you vomited in the past 24 hours?"     - MILD:  1 - 2 times/day    - MODERATE: 3 - 5 times/day, decreased oral intake without significant weight loss or symptoms of dehydration    - SEVERE: 6 or more times/day, vomits everything or nearly everything, with significant weight loss, symptoms of dehydration   20 2. ONSET: "When did the vomiting begin?"      Wed[place]ya 3. FLUIDS: "What fluids or food have you vomited up today?" "Have you been able to keep any fluids down?"     gatorade 4. ABDOMINAL PAIN: "Are your having any abdominal pain?" If yes : "How bad is it and what does it feel like?" (e.g., crampy, dull, intermittent, constant)     denies 5. DIARRHEA: "Is there  any diarrhea?" If so, ask: "How many times today?"     2 stool today 6. CONTACTS: "Is there anyone else in the family with the same symptoms?"      denies 7. CAUSE: "What do you think is causing your vomiting?"     unknown 8. HYDRATION STATUS: "Any signs of dehydration?" (e.g., dry mouth [not only dry lips], too weak to stand) "When did you last urinate?"     Yes to dry mouth, denies dizziness,urinated twice today 9. OTHER SYMPTOMS: "Do you have any other symptoms?" (e.g., fever, headache, vertigo, vomiting blood or coffee grounds, recent head injury)     Reports slight headache, denies dizziness 10. PREGNANCY: "Is there any chance you are pregnant?" "When was your last menstrual period?"       na  Answer Assessment - Initial Assessment Questions 1. ONSET: "When did the cough begin?"    Wednesday 2. SEVERITY: "How bad is the cough today?"      10 3. RESPIRATORY DISTRESS: "Describe your breathing."    Denies SOB 4. FEVER: "Do you have a fever?" If so, ask: "What is your temperature, how was it measured, and when did it start?"     dont have therm.  Denies Chils 5. HEMOPTYSIS: "Are you coughing up any blood?" If so ask: "How much?" (flecks, streaks, tablespoons, etc.)    denies 6. TREATMENT: "What have you done so far to treat the  cough?" (e.g., meds, fluids, humidifier)    Denies ARDIAC HISTORY: "Do you have any history of heart disease?" (e.g., heart attack, congestive heart failure)      *No Answer* 8. LUNG HISTORY: "Do you have any history of lung disease?"  (e.g., pulmonary embolus, asthma, emphysema)     denies 9. PE RISK FACTORS: "Do you have a history of blood clots?" (or: recent major surgery, recent prolonged travel, bedridden)     denies 10. OTHER SYMPTOMS: "Do you have any other symptoms? (e.g., runny nose, wheezing, chest pain)      A little chest  pain  Rated 3  11. PREGNANCY: "Is there any chance you are pregnant?" "When was your last menstrual period?"       na 12.  TRAVEL: "Have you traveled out of the country in the last month?" (e.g., travel history, exposures)       denies  Protocols used: COUGH - ACUTE NON-PRODUCTIVE-A-AH, VOMITING-A-AH

## 2018-11-06 NOTE — Telephone Encounter (Signed)
Scheduled patient for today at [facility] at 3:30 pm.  Testing protocol reviewed.

## 2018-11-06 NOTE — Progress Notes (Signed)
BP 107/77 (BP Location: Left Arm, Patient Position: Sitting, Cuff Size: Normal)   Pulse 68   Wt 142 lb (64.4 kg)   LMP  (LMP Unknown)   BMI 25.97 kg/m    Subjective:    Patient ID: Vicki Schultz, female    DOB: [birth year], 48 y.o.   MRN: [id number]  HPI: Vicki Schultz is a 48 y.o. female  Chief Complaint  Patient presents with  . Diarrhea    Symptoms began Wednesday night. Symptoms have progressed since.   . Sore Throat  . Cough  . Nausea  . Emesis    . This visit was completed via Doximity due to the restrictions of the COVID-19 pandemic. All issues as above were discussed and addressed. Physical exam was done as above through visual confirmation on Doximity video. If it was felt that the patient should be evaluated in the office, they were directed there. The patient verbally consented to this visit. . Location of the patient: home . Location of the provider: home . Those involved with this call:  . Provider: Aura DialsJolene Armya Westerhoff, DNP . CMA: Myrtha MantisKeri Bullock, CMA . Front Desk/Registration: Harriet PhoJoliza Johnson  . Time spent on call: 15 minutes with patient face to face via video conference. More than 50% of this time was spent in counseling and coordination of care. 10 minutes total spent in review of patient's record and preparation of their chart. I verified patient identity using two factors (patient name and date of birth). Patient consents verbally to being seen via telemedicine visit today.   COUGH On Wednesday night started having "bad diarrhea".  This happened all night long.  Yesterday she started to have N&V, chills, headache, and sore throat.  She does not know of any exposure to anyone with Covid 19.  Reports she broke her back two weeks ago and has been home for 2 weeks, was in ER 2 weeks ago.  No recent travel overseas.  Has not been eating (only a couple crackers), but has been drinking Gatorade.  States she is drinking about 10 oz every 2 hours.  States she has not  had emesis today, but did have a few episodes yesterday.  Denies dysuria, frequency, urgency, or vaginal discharge.  No episodes of incontinence.  Reports current diarrhea, yesterday was having bouts every hour.  States today she has gone twice, loose, but it appears to be "slowing down".  Denies loss of taste or abdominal pain.  Was on Norco for discomfort recently for her back, but reports she has not taken that in a few days.   Worst symptom: headache and diarrhea Fever: no, has had chills Cough: yes Shortness of breath: no Wheezing: no Chest pain: no Chest tightness: no Chest congestion: no Nasal congestion: no Runny nose: no Post nasal drip: no Sneezing: no Sore throat: no Swollen glands: no Sinus pressure: no Headache: no Face pain: no Toothache: no Ear pain: none Ear pressure: none Eyes red/itching:no Eye drainage/crusting: no  Vomiting: yes Rash: no Fatigue: yes Sick contacts: no Strep contacts: no  Context: stable Recurrent sinusitis: no Relief with OTC cold/cough medications: no  Treatments attempted: not taking any medication   Relevant past medical, surgical, family and social history reviewed and updated as indicated. Interim medical history since our last visit reviewed. Allergies and medications reviewed and updated.  Review of Systems  Constitutional: Positive for appetite change, chills and fatigue. Negative for activity change and fever.  HENT: Positive for sore throat. Negative for congestion,  ear discharge, ear pain, facial swelling, postnasal drip, rhinorrhea, sinus pressure, sinus pain, sneezing and voice change.   Eyes: Negative for pain and visual disturbance.  Respiratory: Positive for cough. Negative for chest tightness, shortness of breath and wheezing.   Cardiovascular: Negative for chest pain, palpitations and leg swelling.  Gastrointestinal: Positive for diarrhea, nausea and vomiting. Negative for abdominal distention, abdominal pain and  constipation.  Endocrine: Negative.   Musculoskeletal: Negative for myalgias.  Neurological: Negative for dizziness, weakness, numbness and headaches.  Psychiatric/Behavioral: Negative.     Per HPI unless specifically indicated above     Objective:    BP 107/77 (BP Location: Left Arm, Patient Position: Sitting, Cuff Size: Normal)   Pulse 68   Wt 142 lb (64.4 kg)   LMP  (LMP Unknown)   BMI 25.97 kg/m   Wt Readings from Last 3 Encounters:  11/06/18 142 lb (64.4 kg)  10/23/18 149 lb (67.6 kg)  03/25/18 135 lb 3 oz (61.3 kg)    Physical Exam Vitals signs and nursing note reviewed.  Constitutional:      General: She is awake. She is not in acute distress.    Appearance: She is well-developed. She is not ill-appearing.     Comments: Talkative without SOB.  HENT:     Head: Normocephalic.     Right Ear: Hearing normal.     Left Ear: Hearing normal.     Nose: Nose normal.     Right Sinus: No maxillary sinus tenderness or frontal sinus tenderness.     Left Sinus: No maxillary sinus tenderness or frontal sinus tenderness.     Comments: Self palpated sinuses.    Mouth/Throat:     Mouth: Mucous membranes are moist.     Pharynx: Posterior oropharyngeal erythema (mild erythema noted via virtual exam) present. No pharyngeal swelling or oropharyngeal exudate.  Eyes:     General: Lids are normal.        Right eye: No discharge.        Left eye: No discharge.     Conjunctiva/sclera: Conjunctivae normal.  Neck:     Musculoskeletal: Normal range of motion.  Cardiovascular:     Comments: Unable to auscultate due to virtual exam only  Pulmonary:     Effort: Pulmonary effort is normal. No accessory muscle usage or respiratory distress.     Comments: Unable to auscultate due to virtual exam only  Abdominal:     Tenderness: There is no abdominal tenderness.     Comments: Unable to auscultate due to virtual exam only.  Patient did self palpation with guidance and denies any tenderness.   Neurological:     Mental Status: She is alert and oriented to person, place, and time.  Psychiatric:        Attention and Perception: Attention normal.        Mood and Affect: Mood normal.        Behavior: Behavior normal. Behavior is cooperative.        Thought Content: Thought content normal.        Judgment: Judgment normal.     Results for orders placed or performed in visit on 03/25/18  HSV(herpes simplex vrs) 1+2 ab-IgG  Result Value Ref Range   HSV 1 Glycoprotein G Ab, IgG 11.80 (H) 0.00 - 0.90 index   HSV 2 IgG, Type Spec 1.33 (H) 0.00 - 0.90 index  CBC with Differential/Platelet  Result Value Ref Range   WBC 5.8 3.4 - 10.8 x10E3/uL  RBC 4.36 3.77 - 5.28 x10E6/uL   Hemoglobin 13.5 11.1 - 15.9 g/dL   Hematocrit 40.1 34.0 - 46.6 %   MCV 92 79 - 97 fL   MCH 31.0 26.6 - 33.0 pg   MCHC 33.7 31.5 - 35.7 g/dL   RDW 12.3 12.3 - 15.4 %   Platelets 259 150 - 450 x10E3/uL   Neutrophils 39 Not Estab. %   Lymphs 51 Not Estab. %   Monocytes 8 Not Estab. %   Eos 1 Not Estab. %   Basos 1 Not Estab. %   Neutrophils Absolute 2.2 1.4 - 7.0 x10E3/uL   Lymphocytes Absolute 2.9 0.7 - 3.1 x10E3/uL   Monocytes Absolute 0.5 0.1 - 0.9 x10E3/uL   EOS (ABSOLUTE) 0.1 0.0 - 0.4 x10E3/uL   Basophils Absolute 0.1 0.0 - 0.2 x10E3/uL   Immature Granulocytes 0 Not Estab. %   Immature Grans (Abs) 0.0 0.0 - 0.1 x10E3/uL  Comprehensive metabolic panel  Result Value Ref Range   Glucose 86 65 - 99 mg/dL   BUN 13 6 - 24 mg/dL   Creatinine, Ser 0.95 0.57 - 1.00 mg/dL   GFR calc non Af Amer 71 >59 mL/min/1.73   GFR calc Af Amer 81 >59 mL/min/1.73   BUN/Creatinine Ratio 14 9 - 23   Sodium 141 134 - 144 mmol/L   Potassium 4.0 3.5 - 5.2 mmol/L   Chloride 102 96 - 106 mmol/L   CO2 24 20 - 29 mmol/L   Calcium 9.6 8.7 - 10.2 mg/dL   Total Protein 6.9 6.0 - 8.5 g/dL   Albumin 4.5 3.5 - 5.5 g/dL   Globulin, Total 2.4 1.5 - 4.5 g/dL   Albumin/Globulin Ratio 1.9 1.2 - 2.2   Bilirubin Total 0.2 0.0 - 1.2  mg/dL   Alkaline Phosphatase 83 39 - 117 IU/L   AST 19 0 - 40 IU/L   ALT 19 0 - 32 IU/L  Lipid Panel w/o Chol/HDL Ratio  Result Value Ref Range   Cholesterol, Total 224 (H) 100 - 199 mg/dL   Triglycerides 190 (H) 0 - 149 mg/dL   HDL 37 (L) >39 mg/dL   VLDL Cholesterol Cal 38 5 - 40 mg/dL   LDL Calculated 149 (H) 0 - 99 mg/dL  TSH  Result Value Ref Range   TSH 2.140 0.450 - 4.500 uIU/mL  VITAMIN D 25 Hydroxy (Vit-D Deficiency, Fractures)  Result Value Ref Range   Vit D, 25-Hydroxy 21.7 (L) 30.0 - 100.0 ng/mL  B12 and Folate Panel  Result Value Ref Range   Vitamin B-12 306 232 - 1,245 pg/mL   Folate 6.3 >3.0 ng/mL  Vitamin B1  Result Value Ref Range   Thiamine 98.2 66.5 - 200.0 nmol/L  HSV-2 IgG Supplemental Test  Result Value Ref Range   HSV-2 IgG Supplemental Test Positive (A) Negative  Cytology - PAP  Result Value Ref Range   Adequacy      Satisfactory for evaluation  endocervical/transformation zone component PRESENT.   Diagnosis      NEGATIVE FOR INTRAEPITHELIAL LESIONS OR MALIGNANCY.   HPV NOT DETECTED    Material Submitted CervicoVaginal Pap [ThinPrep Imaged]       Assessment & Plan:   Problem List Items Addressed This Visit      Other   Diarrhea    Acute, suspect viral.  Due to symptoms will obtain Covid testing.  Message sent to [facility].  Have recommended maintaining a 14 day at home quarantine at this time based on current  symptoms.  Patient agrees with this POC.  They will return in 2 weeks for follow-up.  Script sent for Zofran.  Recommended Tylenol as needed for sore throat and headache.  Continue increased hydration and rest.  For worsening symptoms she is aware to go immediately to ER if after office hours or call office during regular hours.         I discussed the assessment and treatment plan with the patient. The patient was provided an opportunity to ask questions and all were answered. The patient agreed with the plan and demonstrated an understanding of  the instructions.   The patient was advised to call back or seek an in-person evaluation if the symptoms worsen or if the condition fails to improve as anticipated.   I provided 15 minutes of time during this encounter.  Follow up plan: Return in about 2 weeks (around 11/20/2018) for Covid and acute follow-up.

## 2018-11-06 NOTE — Assessment & Plan Note (Signed)
Acute, suspect viral.  Due to symptoms will obtain Covid testing.  Message sent to [facility].  Have recommended maintaining a 14 day at home quarantine at this time based on current symptoms.  Patient agrees with this POC.  They will return in 2 weeks for follow-up.  Script sent for Zofran.  Recommended Tylenol as needed for sore throat and headache.  Continue increased hydration and rest.  For worsening symptoms she is aware to go immediately to ER if after office hours or call office during regular hours.

## 2018-11-08 ENCOUNTER — Encounter: Payer: Self-pay | Admitting: Family Medicine

## 2018-11-09 ENCOUNTER — Other Ambulatory Visit: Payer: Self-pay

## 2018-11-09 ENCOUNTER — Ambulatory Visit (INDEPENDENT_AMBULATORY_CARE_PROVIDER_SITE_OTHER): Payer: Self-pay | Admitting: Family Medicine

## 2018-11-09 ENCOUNTER — Encounter: Payer: Self-pay | Admitting: Family Medicine

## 2018-11-09 VITALS — BP 106/80 | HR 67 | Temp 98.3°F | Ht 62.0 in | Wt 143.0 lb

## 2018-11-09 DIAGNOSIS — M5442 Lumbago with sciatica, left side: Secondary | ICD-10-CM

## 2018-11-09 DIAGNOSIS — R197 Diarrhea, unspecified: Secondary | ICD-10-CM

## 2018-11-09 MED ORDER — CYCLOBENZAPRINE HCL 10 MG PO TABS: 10.0000 mg | ORAL_TABLET | Freq: Every day | ORAL | 0 refills | Status: DC

## 2018-11-09 NOTE — Patient Instructions (Addendum)
Sciatica Rehab Ask your health care provider which exercises are safe for you. Do exercises exactly as told by your health care provider and adjust them as directed. It is normal to feel mild stretching, pulling, tightness, or discomfort as you do these exercises. Stop right away if you feel sudden pain or your pain gets worse. Do not begin these exercises until told by your health care provider. Stretching and range-of-motion exercises These exercises warm up your muscles and joints and improve the movement and flexibility of your hips and back. These exercises also help to relieve pain, numbness, and tingling. Sciatic nerve glide 1. Sit in a chair with your head facing down toward your chest. Place your hands behind your back. Let your shoulders slump forward. 2. Slowly straighten one of your legs while you tilt your head back as if you are looking toward the ceiling. Only straighten your leg as far as you can without making your symptoms worse. 3. Hold this position for __________ seconds. 4. Slowly return to the starting position. 5. Repeat with your other leg. Repeat __________ times. Complete this exercise __________ times a day. Knee to chest with hip adduction and internal rotation  1. Lie on your back on a firm surface with both legs straight. 2. Bend one of your knees and move it up toward your chest until you feel a gentle stretch in your lower back and buttock. Then, move your knee toward the shoulder that is on the opposite side from your leg. This is hip adduction and internal rotation. ? Hold your leg in this position by holding on to the front of your knee. 3. Hold this position for __________ seconds. 4. Slowly return to the starting position. 5. Repeat with your other leg. Repeat __________ times. Complete this exercise __________ times a day. Prone extension on elbows  1. Lie on your abdomen on a firm surface. A bed may be too soft for this exercise. 2. Prop yourself up on  your elbows. 3. Use your arms to help lift your chest up until you feel a gentle stretch in your abdomen and your lower back. ? This will place some of your body weight on your elbows. If this is uncomfortable, try stacking pillows under your chest. ? Your hips should stay down, against the surface that you are lying on. Keep your hip and back muscles relaxed. 4. Hold this position for __________ seconds. 5. Slowly relax your upper body and return to the starting position. Repeat __________ times. Complete this exercise __________ times a day. Strengthening exercises These exercises build strength and endurance in your back. Endurance is the ability to use your muscles for a long time, even after they get tired. Pelvic tilt This exercise strengthens the muscles that lie deep in the abdomen. 1. Lie on your back on a firm surface. Bend your knees and keep your feet flat on the floor. 2. Tense your abdominal muscles. Tip your pelvis up toward the ceiling and flatten your lower back into the floor. ? To help with this exercise, you may place a small towel under your lower back and try to push your back into the towel. 3. Hold this position for __________ seconds. 4. Let your muscles relax completely before you repeat this exercise. Repeat __________ times. Complete this exercise __________ times a day. Alternating arm and leg raises  1. Get on your hands and knees on a firm surface. If you are on a hard floor, you may want to use   padding, such as an exercise mat, to cushion your knees. 2. Line up your arms and legs. Your hands should be directly below your shoulders, and your knees should be directly below your hips. 3. Lift your left leg behind you. At the same time, raise your right arm and straighten it in front of you. ? Do not lift your leg higher than your hip. ? Do not lift your arm higher than your shoulder. ? Keep your abdominal and back muscles tight. ? Keep your hips facing the  ground. ? Do not arch your back. ? Keep your balance carefully, and do not hold your breath. 4. Hold this position for __________ seconds. 5. Slowly return to the starting position. 6. Repeat with your right leg and your left arm. Repeat __________ times. Complete this exercise __________ times a day. Posture and body mechanics Good posture and healthy body mechanics can help to relieve stress in your body's tissues and joints. Body mechanics refers to the movements and positions of your body while you do your daily activities. Posture is part of body mechanics. Good posture means:  Your spine is in its natural S-curve position (neutral).  Your shoulders are pulled back slightly.  Your head is not tipped forward. Follow these guidelines to improve your posture and body mechanics in your everyday activities. Standing   When standing, keep your spine neutral and your feet about hip width apart. Keep a slight bend in your knees. Your ears, shoulders, and hips should line up.  When you do a task in which you stand in one place for a long time, place one foot up on a stable object that is 2-4 inches (5-10 cm) high, such as a footstool. This helps keep your spine neutral. Sitting   When sitting, keep your spine neutral and keep your feet flat on the floor. Use a footrest, if necessary, and keep your thighs parallel to the floor. Avoid rounding your shoulders, and avoid tilting your head forward.  When working at a desk or a computer, keep your desk at a height where your hands are slightly lower than your elbows. Slide your chair under your desk so you are close enough to maintain good posture.  When working at a computer, place your monitor at a height where you are looking straight ahead and you do not have to tilt your head forward or downward to look at the screen. Resting  When lying down and resting, avoid positions that are most painful for you.  If you have pain with activities  such as sitting, bending, stooping, or squatting, lie in a position in which your body does not bend very much. For example, avoid curling up on your side with your arms and knees near your chest (fetal position).  If you have pain with activities such as standing for a long time or reaching with your arms, lie with your spine in a neutral position and bend your knees slightly. Try the following positions: ? Lying on your side with a pillow between your knees. ? Lying on your back with a pillow under your knees. Lifting   When lifting objects, keep your feet at least shoulder width apart and tighten your abdominal muscles.  Bend your knees and hips and keep your spine neutral. It is important to lift using the strength of your legs, not your back. Do not lock your knees straight out.  Always ask for help to lift heavy or awkward objects. This information is not   intended to replace advice given to you by your health care provider. Make sure you discuss any questions you have with your health care provider. Document Released: 04/29/2005 Document Revised: 08/21/2018 Document Reviewed: 05/21/2018 Elsevier Patient Education  2020 [place] Diet A bland diet consists of foods that are often soft and do not have a lot of fat, fiber, or extra seasonings. Foods without fat, fiber, or seasoning are easier for the body to digest. They are also less likely to irritate your mouth, throat, stomach, and other parts of your digestive system. A bland diet is sometimes called a BRAT diet. What is my plan? Your health care provider or food and nutrition specialist (dietitian) may recommend specific changes to your diet to prevent symptoms or to treat your symptoms. These changes may include:  Eating small meals often.  Cooking food until it is soft enough to chew easily.  Chewing your food well.  Drinking fluids slowly.  Not eating foods that are very spicy, sour, or fatty.  Not eating  citrus fruits, such as oranges and grapefruit. What do I need to know about this diet?  Eat a variety of foods from the bland diet food list.  Do not follow a bland diet longer than needed.  Ask your health care provider whether you should take vitamins or supplements. What foods can I eat? Grains  Hot cereals, such as cream of wheat. Rice. Bread, crackers, or tortillas made from refined white flour. Vegetables Canned or cooked vegetables. Mashed or boiled potatoes. Fruits  Bananas. Applesauce. Other types of cooked or canned fruit with the skin and seeds removed, such as canned peaches or pears. Meats and other proteins  Scrambled eggs. Creamy peanut butter or other nut butters. Lean, well-cooked meats, such as chicken or fish. Tofu. Soups or broths. Dairy Low-fat dairy products, such as milk, cottage cheese, or yogurt. Beverages  Water. Herbal tea. Apple juice. Fats and oils Mild salad dressings. Canola or olive oil. Sweets and desserts Pudding. Custard. Fruit gelatin. Ice cream. The items listed above may not be a complete list of recommended foods and beverages. Contact a dietitian for more options. What foods are not recommended? Grains Whole grain breads and cereals. Vegetables Raw vegetables. Fruits Raw fruits, especially citrus, berries, or dried fruits. Dairy Whole fat dairy foods. Beverages Caffeinated drinks. Alcohol. Seasonings and condiments Strongly flavored seasonings or condiments. Hot sauce. Salsa. Other foods Spicy foods. Fried foods. Sour foods, such as pickled or fermented foods. Foods with high sugar content. Foods high in fiber. The items listed above may not be a complete list of foods and beverages to avoid. Contact a dietitian for more information. Summary  A bland diet consists of foods that are often soft and do not have a lot of fat, fiber, or extra seasonings.  Foods without fat, fiber, or seasoning are easier for the body to digest.   Check with your health care provider to see how long you should follow this diet plan. It is not meant to be followed for long periods. This information is not intended to replace advice given to you by your health care provider. Make sure you discuss any questions you have with your health care provider. Document Released: 08/21/2015 Document Revised: 05/28/2017 Document Reviewed: 05/28/2017 Elsevier Patient Education  2020 [employer].

## 2018-11-09 NOTE — Progress Notes (Signed)
BP 106/80   Pulse 67   Temp 98.3 F (36.8 C) (Oral)   Ht 5\' 2"  (1.575 m)   Wt 143 lb (64.9 kg)   LMP  (LMP Unknown)   BMI 26.16 kg/m    Subjective:    Patient ID: Vicki Schultz, female    DOB: [birth year], 48 y.o.   MRN: [id number]  HPI: Vicki Schultz is a 48 y.o. female  Chief Complaint  Patient presents with  . Fracture    2w f/u pt states she is feeling better  . Sore Throat  . Cough   Vicki BonitoChristy presents today for follow up on her fracture of her transverse process of her lumbar vertebrae following a fall. Back is doing a bit better. Using heat on it about every 5 hours and that seems to be helping. She notes that it's better with laying down. Feels worse when she is walking. She notes that she has a tingling pain that comes out of her buttock and goes down her buttock.  She saw my colleague 3 days ago for viral diarrheal symptoms, having chills and sweats and vomiting. Scheduled for COVID testing which was done on Friday- she is still awaiting the results. She is feeling a lot better. Still having some diarrhea. Taking an anti-diarrheal and is doing better. No fevers or SOB.   Relevant past medical, surgical, family and social history reviewed and updated as indicated. Interim medical history since our last visit reviewed. Allergies and medications reviewed and updated.  Review of Systems  Constitutional: Negative.   Respiratory: Negative.   Cardiovascular: Negative.   Gastrointestinal: Positive for diarrhea. Negative for abdominal distention, abdominal pain, anal bleeding, blood in stool, constipation, nausea, rectal pain and vomiting.  Musculoskeletal: Positive for back pain. Negative for arthralgias, gait problem, joint swelling, myalgias, neck pain and neck stiffness.  Skin: Negative.   Neurological: Negative.   Psychiatric/Behavioral: Negative.     Per HPI unless specifically indicated above     Objective:    BP 106/80   Pulse 67   Temp 98.3 F  (36.8 C) (Oral)   Ht 5\' 2"  (1.575 m)   Wt 143 lb (64.9 kg)   LMP  (LMP Unknown)   BMI 26.16 kg/m   Wt Readings from Last 3 Encounters:  11/09/18 143 lb (64.9 kg)  11/06/18 142 lb (64.4 kg)  10/23/18 149 lb (67.6 kg)    Physical Exam Vitals signs and nursing note reviewed.  Constitutional:      General: She is not in acute distress.    Appearance: Normal appearance. She is not ill-appearing, toxic-appearing or diaphoretic.  HENT:     Head: Normocephalic and atraumatic.     Right Ear: External ear normal.     Left Ear: External ear normal.     Nose: Nose normal.     Mouth/Throat:     Mouth: Mucous membranes are moist.     Pharynx: Oropharynx is clear.  Eyes:     General: No scleral icterus.       Right eye: No discharge.        Left eye: No discharge.     Conjunctiva/sclera: Conjunctivae normal.     Pupils: Pupils are equal, round, and reactive to light.  Neck:     Musculoskeletal: Normal range of motion.  Pulmonary:     Effort: Pulmonary effort is normal. No respiratory distress.     Comments: Speaking in full sentences Musculoskeletal: Normal range of motion.  Skin:  Coloration: Skin is not jaundiced or pale.     Findings: No bruising, erythema, lesion or rash.  Neurological:     Mental Status: She is alert and oriented to person, place, and time. Mental status is at baseline.  Psychiatric:        Mood and Affect: Mood normal.        Behavior: Behavior normal.        Thought Content: Thought content normal.        Judgment: Judgment normal.     Results for orders placed or performed in visit on 03/25/18  HSV(herpes simplex vrs) 1+2 ab-IgG  Result Value Ref Range   HSV 1 Glycoprotein G Ab, IgG 11.80 (H) 0.00 - 0.90 index   HSV 2 IgG, Type Spec 1.33 (H) 0.00 - 0.90 index  CBC with Differential/Platelet  Result Value Ref Range   WBC 5.8 3.4 - 10.8 x10E3/uL   RBC 4.36 3.77 - 5.28 x10E6/uL   Hemoglobin 13.5 11.1 - 15.9 g/dL   Hematocrit 40.940.1 81.134.0 - 46.6 %    MCV 92 79 - 97 fL   MCH 31.0 26.6 - 33.0 pg   MCHC 33.7 31.5 - 35.7 g/dL   RDW 91.412.3 78.212.3 - 95.615.4 %   Platelets 259 150 - 450 x10E3/uL   Neutrophils 39 Not Estab. %   Lymphs 51 Not Estab. %   Monocytes 8 Not Estab. %   Eos 1 Not Estab. %   Basos 1 Not Estab. %   Neutrophils Absolute 2.2 1.4 - 7.0 x10E3/uL   Lymphocytes Absolute 2.9 0.7 - 3.1 x10E3/uL   Monocytes Absolute 0.5 0.1 - 0.9 x10E3/uL   EOS (ABSOLUTE) 0.1 0.0 - 0.4 x10E3/uL   Basophils Absolute 0.1 0.0 - 0.2 x10E3/uL   Immature Granulocytes 0 Not Estab. %   Immature Grans (Abs) 0.0 0.0 - 0.1 x10E3/uL  Comprehensive metabolic panel  Result Value Ref Range   Glucose 86 65 - 99 mg/dL   BUN 13 6 - 24 mg/dL   Creatinine, Ser 2.130.95 0.57 - 1.00 mg/dL   GFR calc non Af Amer 71 >59 mL/min/1.73   GFR calc Af Amer 81 >59 mL/min/1.73   BUN/Creatinine Ratio 14 9 - 23   Sodium 141 134 - 144 mmol/L   Potassium 4.0 3.5 - 5.2 mmol/L   Chloride 102 96 - 106 mmol/L   CO2 24 20 - 29 mmol/L   Calcium 9.6 8.7 - 10.2 mg/dL   Total Protein 6.9 6.0 - 8.5 g/dL   Albumin 4.5 3.5 - 5.5 g/dL   Globulin, Total 2.4 1.5 - 4.5 g/dL   Albumin/Globulin Ratio 1.9 1.2 - 2.2   Bilirubin Total 0.2 0.0 - 1.2 mg/dL   Alkaline Phosphatase 83 39 - 117 IU/L   AST 19 0 - 40 IU/L   ALT 19 0 - 32 IU/L  Lipid Panel w/o Chol/HDL Ratio  Result Value Ref Range   Cholesterol, Total 224 (H) 100 - 199 mg/dL   Triglycerides 086190 (H) 0 - 149 mg/dL   HDL 37 (L) >57>39 mg/dL   VLDL Cholesterol Cal 38 5 - 40 mg/dL   LDL Calculated 846149 (H) 0 - 99 mg/dL  TSH  Result Value Ref Range   TSH 2.140 0.450 - 4.500 uIU/mL  VITAMIN D 25 Hydroxy (Vit-D Deficiency, Fractures)  Result Value Ref Range   Vit D, 25-Hydroxy 21.7 (L) 30.0 - 100.0 ng/mL  B12 and Folate Panel  Result Value Ref Range   Vitamin B-12 306 232 -  1,245 pg/mL   Folate 6.3 >3.0 ng/mL  Vitamin B1  Result Value Ref Range   Thiamine 98.2 66.5 - 200.0 nmol/L  HSV-2 IgG Supplemental Test  Result Value Ref Range    HSV-2 IgG Supplemental Test Positive (A) Negative  Cytology - PAP  Result Value Ref Range   Adequacy      Satisfactory for evaluation  endocervical/transformation zone component PRESENT.   Diagnosis      NEGATIVE FOR INTRAEPITHELIAL LESIONS OR MALIGNANCY.   HPV NOT DETECTED    Material Submitted CervicoVaginal Pap [ThinPrep Imaged]       Assessment & Plan:   Problem List Items Addressed This Visit      Other   Diarrhea    BRAT diet given today. Await COVID results. Out of work until negative results back or 2 weeks, whichever comes first.       Other Visit Diagnoses    Acute right-sided low back pain with left-sided sciatica    -  Primary   Will treat with exercises and flexeril. Call if not getting better or getting worse.    Relevant Medications   cyclobenzaprine (FLEXERIL) 10 MG tablet       Follow up plan: Return if symptoms worsen or fail to improve.   . This visit was completed via D[employer]ity due to the restrictions of the COVID-19 pandemic. All issues as above were discussed and addressed. Physical exam was done as above through visual confirmation on Doximity. If it was felt that the patient should be evaluated in the office, they were directed there. The patient verbally consented to this visit. . Location of the patient: home . Location of the provider: work . Those involved with this call:  . Provider: Park Liter, DO . CMA: Lesle Chris, [place] . Front Desk/Registration: Don Perking  . Time spent on call: 25 minutes with patient face to face via video conference. More than 50% of this time was spent in counseling and coordination of care. 40 minutes total spent in review of patient's record and preparation of their chart.

## 2018-11-09 NOTE — Assessment & Plan Note (Signed)
BRAT diet given today. Await COVID results. Out of work until negative results back or 2 weeks, whichever comes first.

## 2018-11-10 LAB — NOVEL CORONAVIRUS, NAA: SARS-CoV-2, NAA: NOT DETECTED

## 2018-12-29 ENCOUNTER — Other Ambulatory Visit: Payer: Self-pay

## 2018-12-30 ENCOUNTER — Ambulatory Visit
Admission: RE | Admit: 2018-12-30 | Discharge: 2018-12-30 | Disposition: A | Discharge status: Home / Self Care | Payer: Self-pay | Source: Ambulatory Visit | Attending: Family Medicine | Admitting: Family Medicine

## 2018-12-30 ENCOUNTER — Other Ambulatory Visit: Payer: Self-pay

## 2018-12-30 ENCOUNTER — Ambulatory Visit: Payer: Self-pay | Attending: Oncology

## 2018-12-30 ENCOUNTER — Ambulatory Visit
Admission: RE | Admit: 2018-12-30 | Discharge: 2018-12-30 | Disposition: A | Discharge status: Home / Self Care | Payer: Self-pay | Source: Ambulatory Visit | Attending: Oncology | Admitting: Oncology

## 2018-12-30 VITALS — BP 106/79 | HR 71 | Temp 98.5°F | Ht 62.0 in | Wt 144.0 lb

## 2018-12-30 DIAGNOSIS — S32009A Unspecified fracture of unspecified lumbar vertebra, initial encounter for closed fracture: Secondary | ICD-10-CM | POA: Insufficient documentation

## 2018-12-30 DIAGNOSIS — Z0000 Encounter for general adult medical examination without abnormal findings: Secondary | ICD-10-CM

## 2018-12-30 NOTE — Progress Notes (Signed)
  Subjective:     Patient ID: Vicki Schultz, female   DOB: [birth year], 48 y.o.   MRN: [id number]  HPI   Review of Systems     Objective:   Physical Exam Chest:     Breasts:        Right: No swelling, bleeding, inverted nipple, mass, nipple discharge, skin change or tenderness.        Left: No swelling, bleeding, inverted nipple, mass, nipple discharge, skin change or tenderness.        Assessment:     48 year old patient presents for [facility] clinic visit.  Patient screened, and meets BCCCP eligibility.  Patient does not have insurance, Medicare or Medicaid. Instructed patient on breast self awareness using teach back method.  Clinical breast exam unremarkable. Patient reports she did not receive GIA text for risk evaluation.    Plan:     Sent for bilateral screening mammogram.

## 2019-01-06 ENCOUNTER — Encounter: Payer: Self-pay | Admitting: Family Medicine

## 2019-01-06 DIAGNOSIS — M8580 Other specified disorders of bone density and structure, unspecified site: Secondary | ICD-10-CM | POA: Insufficient documentation

## 2019-01-14 NOTE — Progress Notes (Signed)
Letter mailed from [facility] to notify of normal mammogram results.  Patient to return in one year for annual screening.  Copy to [facility]. 

## 2019-04-18 ENCOUNTER — Other Ambulatory Visit: Payer: Self-pay | Admitting: Family Medicine

## 2019-04-19 NOTE — Telephone Encounter (Signed)
Needs appointment

## 2019-04-19 NOTE — Telephone Encounter (Signed)
Requested medication (s) are due for refill today:   Yes  Requested medication (s) are on the active medication list:   Yes  Future visit scheduled:   No   Last ordered: 10/29/2018  #90  1 refill.    Returned due to [facility] not been checked recently and no mention of thyroid in recent visits.      Requested Prescriptions  Pending Prescriptions Disp Refills   levothyroxine (SYNTHROID) 88 MCG tablet [Pharmacy Med Name: LEVOTHYROXINE 0.088MG  (88MCG) TAB] 90 tablet 1    Sig: TAKE 1 TABLET(88 MCG) BY MOUTH DAILY BEFORE BREAKFAST     Endocrinology:  Hypothyroid Agents Failed - 04/18/2019  9:01 PM      Failed - TSH needs to be rechecked within 3 months after an abnormal result. Refill until TSH is due.      Failed - TSH in normal range and within 360 days    TSH  Date Value Ref Range Status  03/25/2018 2.140 0.450 - 4.500 uIU/mL Final         Passed - Valid encounter within last 12 months    Recent Outpatient Visits          5 months ago Acute right-sided low back pain with left-sided sciatica   [place], Megan P, DO   5 months ago Diarrhea, unspecified type   [facility], Jolene T, NP   5 months ago Closed fracture of transverse process of lumbar vertebra, initial encounter [facility])   [facility] Valerie Roys, DO   1 year ago Well woman exam   [place], Megan P, DO   1 year ago Trichomonas infection   [place], [place], DO

## 2019-04-19 NOTE — Telephone Encounter (Signed)
Routing to provider  

## 2019-04-20 NOTE — Telephone Encounter (Signed)
Patient has enough medication until her scheduled appt.

## 2019-04-20 NOTE — Telephone Encounter (Signed)
Does she have enough to make it to 12/15 or does she need to be bridged?

## 2019-04-20 NOTE — Telephone Encounter (Signed)
Called pt schedule 12/14

## 2019-04-21 ENCOUNTER — Other Ambulatory Visit: Payer: Self-pay | Admitting: Family Medicine

## 2019-04-26 ENCOUNTER — Ambulatory Visit: Payer: Self-pay | Admitting: Family Medicine

## 2019-05-19 ENCOUNTER — Ambulatory Visit: Payer: Self-pay | Admitting: Family Medicine

## 2019-05-19 ENCOUNTER — Other Ambulatory Visit: Payer: Self-pay

## 2019-05-19 ENCOUNTER — Telehealth: Payer: Self-pay | Admitting: Family Medicine

## 2019-05-19 DIAGNOSIS — E039 Hypothyroidism, unspecified: Secondary | ICD-10-CM

## 2019-05-19 NOTE — Telephone Encounter (Signed)
Pt understood and verbalized understanding.  

## 2019-05-19 NOTE — Telephone Encounter (Signed)
Needs to come in for tsh and then I will send it in.

## 2019-05-19 NOTE — Telephone Encounter (Signed)
Pt is here in office stating she is out of her levothyroxine, Pt has a physical on 06/18/19, pt want to know if she can have refill sent in until her appointment.

## 2019-05-20 LAB — TSH: TSH: 0.384 u[IU]/mL — ABNORMAL LOW (ref 0.450–4.500)

## 2019-05-21 ENCOUNTER — Other Ambulatory Visit: Payer: Self-pay | Admitting: Family Medicine

## 2019-05-21 ENCOUNTER — Telehealth: Payer: Self-pay | Admitting: Family Medicine

## 2019-05-21 MED ORDER — LEVOTHYROXINE SODIUM 75 MCG PO TABS: 75.0000 ug | ORAL_TABLET | Freq: Every day | ORAL | 2 refills | Status: DC

## 2019-05-21 NOTE — Telephone Encounter (Signed)
Patient is being OVER TREATED. Please CANCEL this Rx. New Rx to be sent to her pharmacy for lower dose.

## 2019-05-21 NOTE — Telephone Encounter (Signed)
Patient notified

## 2019-05-21 NOTE — Telephone Encounter (Signed)
Pt is hoping to have this before the weekend, she is completely out

## 2019-05-21 NOTE — Telephone Encounter (Signed)
Pharmacy notified.

## 2019-05-21 NOTE — Telephone Encounter (Signed)
Please let her know that her thyroid is over treated. I'd like to decrease her to and we'll recheck at her physical. Rx for 88 was sent by [facility]. This is NOT APPROPRIATE. Please cancel 88. Rx for sent to her pharmacy.

## 2019-06-18 ENCOUNTER — Encounter: Payer: Self-pay | Admitting: Family Medicine

## 2019-08-19 ENCOUNTER — Other Ambulatory Visit: Payer: Self-pay | Admitting: Family Medicine

## 2019-08-19 MED ORDER — LEVOTHYROXINE SODIUM 75 MCG PO TABS: 75.0000 ug | ORAL_TABLET | Freq: Every day | ORAL | 2 refills | Status: DC

## 2019-08-19 NOTE — Telephone Encounter (Signed)
Copied from CRM [id number]. Topic: Quick Communication - Rx Refill/Question >> Aug 19, 2019  3:12 PM [place]onia Small, [place] A wrote: Medication: levothyroxine (SYNTHROID) 75 MCG tablet (Patient was under the impression 1 year of refills was sent to pharmacy for medication. Patient would like a callback from nurse once medication has been sent over.)  Has the patient contacted their pharmacy? Yes (Agent: If no, request that the patient contact the pharmacy for the refill.) (Agent: If yes, when and what did the pharmacy advise?)Contact PCP  Preferred Pharmacy (with phone number or street name):[id number] - [place], [place] - [address] [place] [address] AT [facility] [address]  Phone:  [phone] Fax:  [phone]     Agent: Please be advised that RX refills may take up to 3 business days. We ask that you follow-up with your pharmacy.

## 2019-08-19 NOTE — Telephone Encounter (Signed)
Requested Prescriptions  Pending Prescriptions Disp Refills  . levothyroxine (SYNTHROID) 75 MCG tablet 30 tablet 2    Sig: Take 1 tablet (75 mcg total) by mouth daily.     Endocrinology:  Hypothyroid Agents Failed - 08/19/2019  3:16 PM      Failed - TSH needs to be rechecked within 3 months after an abnormal result. Refill until TSH is due.      Failed - TSH in normal range and within 360 days    TSH  Date Value Ref Range Status  05/19/2019 0.384 (L) 0.450 - 4.500 uIU/mL Final         Passed - Valid encounter within last 12 months    Recent Outpatient Visits          9 months ago Acute right-sided low back pain with left-sided sciatica   [facility], Megan P, DO   9 months ago Diarrhea, unspecified type   [facility], Jolene T, NP   10 months ago Closed fracture of transverse process of lumbar vertebra, initial encounter [facility])   [facility] Dorcas Carrow, DO   1 year ago Well woman exam   [facility], Megan P, DO   1 year ago Trichomonas infection   [facility], [place], DO

## 2019-11-19 ENCOUNTER — Other Ambulatory Visit: Payer: Self-pay | Admitting: Family Medicine

## 2019-11-19 NOTE — Telephone Encounter (Signed)
Patient called and left message to return call to schedule appt. Courtesy refill given.

## 2019-12-09 ENCOUNTER — Encounter: Payer: Self-pay | Admitting: Family Medicine

## 2019-12-17 ENCOUNTER — Other Ambulatory Visit: Payer: Self-pay | Admitting: Family Medicine

## 2019-12-17 NOTE — Telephone Encounter (Signed)
Appointment scheduled-8/31- additional RF granted

## 2020-01-11 ENCOUNTER — Ambulatory Visit (INDEPENDENT_AMBULATORY_CARE_PROVIDER_SITE_OTHER): Payer: Self-pay | Admitting: Family Medicine

## 2020-01-11 ENCOUNTER — Other Ambulatory Visit: Payer: Self-pay

## 2020-01-11 ENCOUNTER — Encounter: Payer: Self-pay | Admitting: Family Medicine

## 2020-01-11 VITALS — BP 104/74 | HR 66 | Temp 98.5°F | Ht 61.5 in | Wt 140.0 lb

## 2020-01-11 DIAGNOSIS — Z538 Procedure and treatment not carried out for other reasons: Secondary | ICD-10-CM

## 2020-01-11 NOTE — Progress Notes (Signed)
Patient's insurance goes into effect tomorrow. Will bring her back for physical then.

## 2020-01-13 ENCOUNTER — Encounter: Payer: Self-pay | Admitting: Family Medicine

## 2020-01-13 ENCOUNTER — Other Ambulatory Visit: Payer: Self-pay

## 2020-01-13 ENCOUNTER — Ambulatory Visit: Payer: BC Managed Care – PPO | Admitting: Family Medicine

## 2020-01-13 VITALS — BP 110/76 | HR 48 | Temp 98.2°F | Ht 61.5 in | Wt 140.0 lb

## 2020-01-13 DIAGNOSIS — Z1231 Encounter for screening mammogram for malignant neoplasm of breast: Secondary | ICD-10-CM | POA: Diagnosis not present

## 2020-01-13 DIAGNOSIS — Z0000 Encounter for general adult medical examination without abnormal findings: Secondary | ICD-10-CM | POA: Diagnosis not present

## 2020-01-13 DIAGNOSIS — E039 Hypothyroidism, unspecified: Secondary | ICD-10-CM | POA: Diagnosis not present

## 2020-01-13 MED ORDER — ACYCLOVIR 400 MG PO TABS: 400.0000 mg | ORAL_TABLET | Freq: Three times a day (TID) | ORAL | 4 refills | Status: DC

## 2020-01-13 MED ORDER — CYCLOBENZAPRINE HCL 10 MG PO TABS: 10.0000 mg | ORAL_TABLET | Freq: Every day | ORAL | 2 refills | Status: DC

## 2020-01-13 NOTE — Progress Notes (Signed)
BP 110/76   Pulse (!) 48   Temp 98.2 F (36.8 C) (Oral)   Ht 5' 1.5" (1.562 m)   Wt 140 lb (63.5 kg)   LMP  (LMP Unknown)   SpO2 99%   BMI 26.02 kg/m    Subjective:    Patient ID: Vicki Schultz, female    DOB: 02-Jan-1971, 49 y.o.   MRN: [id number]  HPI: Vicki Schultz is a 49 y.o. female presenting on 01/13/2020 for comprehensive medical examination. Current medical complaints include:  HYPOTHYROIDISM Thyroid control status:unsure Satisfied with current treatment? no Medication side effects: no Medication compliance: excellent compliance Recent dose adjustment:no Fatigue: yes Cold intolerance: yes Heat intolerance: yes Weight gain: no Weight loss: no Constipation: no Diarrhea/loose stools: no Palpitations: no Lower extremity edema: no Anxiety/depressed mood: no  INSOMNIA Duration: chronic Satisfied with sleep quality: yes Difficulty falling asleep: yes Difficulty staying asleep: no Waking a few hours after sleep onset: no Early morning awakenings: no Daytime hypersomnolence: no Wakes feeling refreshed: no Good sleep hygiene: no Apnea: no Snoring: no Depressed/anxious mood: no Recent stress: no Restless legs/nocturnal leg cramps: no Chronic pain/arthritis: no History of sleep study: no Treatments attempted: marijuana    She currently lives with: husband Menopausal Symptoms: no  Depression Screen done today and results listed below:  Depression screen [facility] 2/9 11/20/2017 10/24/2017 08/12/2017 05/01/2016  Decreased Interest 0 0 0 0  Down, Depressed, Hopeless 0 0 0 0  PHQ - 2 Score 0 0 0 0  Altered sleeping - 0 3 0  Tired, decreased energy - 0 3 0  Change in appetite - 0 0 0  Feeling bad or failure about yourself  - 0 0 0  Trouble concentrating - 0 0 1  Moving slowly or fidgety/restless - 0 - 0  Suicidal thoughts - 0 0 0  PHQ-9 Score - 0 6 1  Difficult doing work/chores - Not difficult at all - -    Past Medical History:  Past Medical History:   Diagnosis Date  . Thyroid disease     Surgical History:  Past Surgical History:  Procedure Laterality Date  . CESAREAN SECTION    . ROTATOR CUFF REPAIR Right 08/03/14  . TUBAL LIGATION      Medications:  Current Outpatient Medications on File Prior to Visit  Medication Sig  . EPINEPHrine 0.3 mg/0.3 mL IJ SOAJ injection INJ 1 SYRINGE IM ONCE UTD FOR 1 DOSE  . levothyroxine (SYNTHROID) 75 MCG tablet TAKE 1 TABLET(75 MCG) BY MOUTH DAILY   No current facility-administered medications on file prior to visit.    Allergies:  Allergies  Allergen Reactions  . Flagyl [Metronidazole] Anaphylaxis  . Codeine     Abdominal pain    Social History:  Social History   Socioeconomic History  . Marital status: Single    Spouse name: Not on file  . Number of children: Not on file  . Years of education: Not on file  . Highest education level: Not on file  Occupational History  . Not on file  Tobacco Use  . Smoking status: Former Smoker    Packs/day: 0.50    Types: Cigarettes  . Smokeless tobacco: Never Used  . Tobacco comment: Patient Vapes  Vaping Use  . Vaping Use: Every day  Substance and Sexual Activity  . Alcohol use: Yes    Alcohol/week: 0.0 standard drinks    Comment: two or less per week  . Drug use: No  . Sexual activity: Yes  Other Topics Concern  . Not on file  Social History Narrative  . Not on file   Social Determinants of Health   Financial Resource Strain:   . Difficulty of Paying Living Expenses: Not on file  Food Insecurity:   . Worried About Programme researcher, broadcasting/film/video in the Last Year: Not on file  . Ran Out of Food in the Last Year: Not on file  Transportation Needs:   . Lack of Transportation (Medical): Not on file  . Lack of Transportation (Non-Medical): Not on file  Physical Activity:   . Days of Exercise per Week: Not on file  . Minutes of Exercise per Session: Not on file  Stress:   . Feeling of Stress : Not on file  Social Connections:   .  Frequency of Communication with Friends and Family: Not on file  . Frequency of Social Gatherings with Friends and Family: Not on file  . Attends Religious Services: Not on file  . Active Member of Clubs or Organizations: Not on file  . Attends Banker Meetings: Not on file  . Marital Status: Not on file  Intimate Partner Violence:   . Fear of Current or Ex-Partner: Not on file  . Emotionally Abused: Not on file  . Physically Abused: Not on file  . Sexually Abused: Not on file   Social History   Tobacco Use  Smoking Status Former Smoker  . Packs/day: 0.50  . Types: Cigarettes  Smokeless Tobacco Never Used  Tobacco Comment   Patient Vapes   Social History   Substance and Sexual Activity  Alcohol Use Yes  . Alcohol/week: 0.0 standard drinks   Comment: two or less per week    Family History:  Family History  Problem Relation Age of Onset  . Cancer Mother   . Hypertension Mother   . Mental illness Mother   . Thyroid disease Mother   . Breast cancer Mother 91  . Diabetes Father   . Arthritis Sister   . Hypertension Sister     Past medical history, surgical history, medications, allergies, family history and social history reviewed with patient today and changes made to appropriate areas of the chart.   Review of Systems  Constitutional: Negative.   HENT: Positive for hearing loss. Negative for congestion, ear discharge, ear pain, nosebleeds, sinus pain, sore throat and tinnitus.   Eyes: Negative.   Respiratory: Negative.  Negative for stridor.   Cardiovascular: Negative.   Gastrointestinal: Negative.   Genitourinary: Negative.   Musculoskeletal: Positive for back pain. Negative for falls, joint pain, myalgias and neck pain.  Skin: Negative.   Neurological: Negative.   Endo/Heme/Allergies: Negative for environmental allergies and polydipsia. Bruises/bleeds easily.  Psychiatric/Behavioral: Negative for depression, hallucinations, memory loss, substance  abuse and suicidal ideas. The patient has insomnia. The patient is not nervous/anxious.     All other ROS negative except what is listed above and in the HPI.      Objective:    BP 110/76   Pulse (!) 48   Temp 98.2 F (36.8 C) (Oral)   Ht 5' 1.5" (1.562 m)   Wt 140 lb (63.5 kg)   LMP  (LMP Unknown)   SpO2 99%   BMI 26.02 kg/m   Wt Readings from Last 3 Encounters:  01/13/20 140 lb (63.5 kg)  01/11/20 140 lb (63.5 kg)  12/30/18 144 lb (65.3 kg)    Physical Exam Vitals and nursing note reviewed.  Constitutional:  General: She is not in acute distress.    Appearance: Normal appearance. She is not ill-appearing, toxic-appearing or diaphoretic.  HENT:     Head: Normocephalic and atraumatic.     Right Ear: Tympanic membrane, ear canal and external ear normal. There is no impacted cerumen.     Left Ear: Tympanic membrane, ear canal and external ear normal. There is no impacted cerumen.     Nose: Nose normal. No congestion or rhinorrhea.     Mouth/Throat:     Mouth: Mucous membranes are moist.     Pharynx: Oropharynx is clear. No oropharyngeal exudate or posterior oropharyngeal erythema.  Eyes:     General: No scleral icterus.       Right eye: No discharge.        Left eye: No discharge.     Extraocular Movements: Extraocular movements intact.     Conjunctiva/sclera: Conjunctivae normal.     Pupils: Pupils are equal, round, and reactive to light.  Neck:     Vascular: No carotid bruit.  Cardiovascular:     Rate and Rhythm: Normal rate and regular rhythm.     Pulses: Normal pulses.     Heart sounds: No murmur heard.  No friction rub. No gallop.   Pulmonary:     Effort: Pulmonary effort is normal. No respiratory distress.     Breath sounds: Normal breath sounds. No stridor. No wheezing, rhonchi or rales.  Chest:     Chest wall: No tenderness.     Breasts:        Right: Normal.        Left: Normal.    Abdominal:     General: Abdomen is flat. Bowel sounds are  normal. There is no distension.     Palpations: Abdomen is soft. There is no mass.     Tenderness: There is no abdominal tenderness. There is no right CVA tenderness, left CVA tenderness, guarding or rebound.     Hernia: No hernia is present.  Genitourinary:    Comments: Pelvic exams deferred with shared decision making Musculoskeletal:        General: No swelling, tenderness, deformity or signs of injury.     Cervical back: Normal range of motion and neck supple. No rigidity. No muscular tenderness.     Right lower leg: No edema.     Left lower leg: No edema.  Lymphadenopathy:     Cervical: No cervical adenopathy.  Skin:    General: Skin is warm and dry.     Capillary Refill: Capillary refill takes less than 2 seconds.     Coloration: Skin is not jaundiced or pale.     Findings: No bruising, erythema, lesion or rash.  Neurological:     General: No focal deficit present.     Mental Status: She is alert and oriented to person, place, and time. Mental status is at baseline.     Cranial Nerves: No cranial nerve deficit.     Sensory: No sensory deficit.     Motor: No weakness.     Coordination: Coordination normal.     Gait: Gait normal.     Deep Tendon Reflexes: Reflexes normal.  Psychiatric:        Mood and Affect: Mood normal.        Behavior: Behavior normal.        Thought Content: Thought content normal.        Judgment: Judgment normal.     Results for orders placed or performed  in visit on 05/19/19  TSH  Result Value Ref Range   TSH 0.384 (L) 0.450 - 4.500 uIU/mL      Assessment & Plan:   Problem List Items Addressed This Visit      Endocrine   Hypothyroidism    Rechecking levels today. Await results. Treat as needed.       Relevant Orders   TSH    Other Visit Diagnoses    Routine general medical examination at a health care facility    -  Primary   Vaccines up to date. Screening labs checked today. Pap up to date. Mammo and cologuard ordered. Continue diet  and exercise. Call with any concerns.    Relevant Orders   CBC with Differential/Platelet   Comprehensive metabolic panel   Lipid Panel w/o Chol/HDL Ratio   TSH   Urinalysis, Routine w reflex microscopic   Encounter for screening mammogram for malignant neoplasm of breast       Mammogram ordered today.   Relevant Orders   MM 3D SCREEN BREAST BILATERAL       Follow up plan: Return in about 1 year (around 01/12/2021) for physical.   LABORATORY TESTING:  - Pap smear: up to date  IMMUNIZATIONS:   - Tdap: Tetanus vaccination status reviewed: last tetanus booster within 10 years. - Influenza: Refused - Pneumovax: Up to date - COVID: Up to date  SCREENING: -Mammogram: Up to date  - Colonoscopy: Ordered today   PATIENT COUNSELING:   Advised to take 1 mg of folate supplement per day if capable of pregnancy.   Sexuality: Discussed sexually transmitted diseases, partner selection, use of condoms, avoidance of unintended pregnancy  and contraceptive alternatives.   Advised to avoid cigarette smoking.  I discussed with the patient that most people either abstain from alcohol or drink within safe limits (<=14/week and <=4 drinks/occasion for males, <=7/weeks and <= 3 drinks/occasion for females) and that the risk for alcohol disorders and other health effects rises proportionally with the number of drinks per week and how often a drinker exceeds daily limits.  Discussed cessation/primary prevention of drug use and availability of treatment for abuse.   Diet: Encouraged to adjust caloric intake to maintain  or achieve ideal body weight, to reduce intake of dietary saturated fat and total fat, to limit sodium intake by avoiding high sodium foods and not adding table salt, and to maintain adequate dietary potassium and calcium preferably from fresh fruits, vegetables, and low-fat dairy products.    stressed the importance of regular exercise  Injury prevention: Discussed safety belts,  safety helmets, smoke detector, smoking near bedding or upholstery.   Dental health: Discussed importance of regular tooth brushing, flossing, and dental visits.    NEXT PREVENTATIVE PHYSICAL DUE IN 1 YEAR. Return in about 1 year (around 01/12/2021) for physical.

## 2020-01-13 NOTE — Patient Instructions (Addendum)
Please call at this number ([facility]) to schedule your mammogram. [phone] ° ° °Health Maintenance, Female °Adopting a healthy lifestyle and getting preventive care are important in promoting health and wellness. Ask your health care provider about: °· The right schedule for you to have regular tests and exams. °· Things you can do on your own to prevent diseases and keep yourself healthy. °What should I know about diet, weight, and exercise? °Eat a healthy diet ° °· Eat a diet that includes plenty of vegetables, fruits, low-fat dairy products, and lean protein. °· Do not eat a lot of foods that are high in solid fats, added sugars, or sodium. °Maintain a healthy weight °Body mass index (BMI) is used to identify weight problems. It estimates body fat based on height and weight. Your health care provider can help determine your BMI and help you achieve or maintain a healthy weight. °Get regular exercise °Get regular exercise. This is one of the most important things you can do for your health. Most adults should: °· Exercise for at least 150 minutes each week. The exercise should increase your heart rate and make you sweat (moderate-intensity exercise). °· Do strengthening exercises at least twice a week. This is in addition to the moderate-intensity exercise. °· Spend less time sitting. Even light physical activity can be beneficial. °Watch cholesterol and blood lipids °Have your blood tested for lipids and cholesterol at 51 years of age, then have this test every 5 years. °Have your cholesterol levels checked more often if: °· Your lipid or cholesterol levels are high. °· You are older than 51 years of age. °· You are at high risk for heart disease. °What should I know about cancer screening? °Depending on your health history and family history, you may need to have cancer screening at various ages. This may include screening for: °· Breast cancer. °· Cervical cancer. °· Colorectal  cancer. °· Skin cancer. °· Lung cancer. °What should I know about heart disease, diabetes, and high blood pressure? °Blood pressure and heart disease °· High blood pressure causes heart disease and increases the risk of stroke. This is more likely to develop in people who have high blood pressure readings, are of African descent, or are overweight. °· Have your blood pressure checked: °? Every 3-5 years if you are 18-39 years of age. °? Every year if you are 40 years old or older. °Diabetes °Have regular diabetes screenings. This checks your fasting blood sugar level. Have the screening done: °· Once every three years after age 40 if you are at a normal weight and have a low risk for diabetes. °· More often and at a younger age if you are overweight or have a high risk for diabetes. °What should I know about preventing infection? °Hepatitis B °If you have a higher risk for hepatitis B, you should be screened for this virus. Talk with your health care provider to find out if you are at risk for hepatitis B infection. °Hepatitis C °Testing is recommended for: °· Everyone born from 1945 through 1965. °· Anyone with known risk factors for hepatitis C. °Sexually transmitted infections (STIs) °· Get screened for STIs, including gonorrhea and chlamydia, if: °? You are sexually active and are younger than 51 years of age. °? You are older than 51 years of age and your health care provider tells you that you are at risk for this type of infection. °? Your sexual activity has changed since you were last screened,   and you are at increased risk for chlamydia or gonorrhea. Ask your health care provider if you are at risk. °· Ask your health care provider about whether you are at high risk for HIV. Your health care provider may recommend a prescription medicine to help prevent HIV infection. If you choose to take medicine to prevent HIV, you should first get tested for HIV. You should then be tested every 3 months for as long as  you are taking the medicine. °Pregnancy °· If you are about to stop having your period (premenopausal) and you may become pregnant, seek counseling before you get pregnant. °· Take 400 to 800 micrograms (mcg) of folic acid every day if you become pregnant. °· Ask for birth control (contraception) if you want to prevent pregnancy. °Osteoporosis and menopause °Osteoporosis is a disease in which the bones lose minerals and strength with aging. This can result in bone fractures. If you are 65 years old or older, or if you are at risk for osteoporosis and fractures, ask your health care provider if you should: °· Be screened for bone loss. °· Take a calcium or vitamin D supplement to lower your risk of fractures. °· Be given hormone replacement therapy (HRT) to treat symptoms of menopause. °Follow these instructions at home: °Lifestyle °· Do not use any products that contain nicotine or tobacco, such as cigarettes, e-cigarettes, and chewing tobacco. If you need help quitting, ask your health care provider. °· Do not use street drugs. °· Do not share needles. °· Ask your health care provider for help if you need support or information about quitting drugs. °Alcohol use °· Do not drink alcohol if: °? Your health care provider tells you not to drink. °? You are pregnant, may be pregnant, or are planning to become pregnant. °· If you drink alcohol: °? Limit how much you use to 0-1 drink a day. °? Limit intake if you are breastfeeding. °· Be aware of how much alcohol is in your drink. In the [place] one drink equals one 12 oz bottle of beer (355 mL), one 5 oz glass of wine (148 mL), or one 1½ oz glass of hard liquor (44 mL). °General instructions °· Schedule regular health, dental, and eye exams. °· Stay current with your vaccines. °· Tell your health care provider if: °? You often feel depressed. °? You have ever been abused or do not feel safe at home. °Summary °· Adopting a healthy lifestyle and getting preventive care are  important in promoting health and wellness. °· Follow your health care provider's instructions about healthy diet, exercising, and getting tested or screened for diseases. °· Follow your health care provider's instructions on monitoring your cholesterol and blood pressure. °This information is not intended to replace advice given to you by your health care provider. Make sure you discuss any questions you have with your health care provider. °Document Revised: 04/22/2018 Document Reviewed: 04/22/2018 °Elsevier Patient Education © 2020 [employer] ° °

## 2020-01-13 NOTE — Assessment & Plan Note (Signed)
Rechecking levels today. Await results. Treat as needed.  

## 2020-01-14 ENCOUNTER — Other Ambulatory Visit: Payer: Self-pay | Admitting: Family Medicine

## 2020-01-14 LAB — CBC WITH DIFFERENTIAL/PLATELET
Basophils Absolute: 0.1 10*3/uL (ref 0.0–0.2)
Basos: 1 %
EOS (ABSOLUTE): 0.4 10*3/uL (ref 0.0–0.4)
Eos: 5 %
Hematocrit: 40.4 % (ref 34.0–46.6)
Hemoglobin: 13.5 g/dL (ref 11.1–15.9)
Immature Grans (Abs): 0 10*3/uL (ref 0.0–0.1)
Immature Granulocytes: 0 %
Lymphocytes Absolute: 3.8 10*3/uL — ABNORMAL HIGH (ref 0.7–3.1)
Lymphs: 48 %
MCH: 30.9 pg (ref 26.6–33.0)
MCHC: 33.4 g/dL (ref 31.5–35.7)
MCV: 92 fL (ref 79–97)
Monocytes Absolute: 0.5 10*3/uL (ref 0.1–0.9)
Monocytes: 6 %
Neutrophils Absolute: 3.2 10*3/uL (ref 1.4–7.0)
Neutrophils: 40 %
Platelets: 265 10*3/uL (ref 150–450)
RBC: 4.37 x10E6/uL (ref 3.77–5.28)
RDW: 12.2 % (ref 11.7–15.4)
WBC: 8 10*3/uL (ref 3.4–10.8)

## 2020-01-14 LAB — COMPREHENSIVE METABOLIC PANEL
ALT: 20 IU/L (ref 0–32)
AST: 19 IU/L (ref 0–40)
Albumin/Globulin Ratio: 1.6 (ref 1.2–2.2)
Albumin: 4.4 g/dL (ref 3.8–4.9)
Alkaline Phosphatase: 91 IU/L (ref 48–121)
BUN/Creatinine Ratio: 15 (ref 9–23)
BUN: 14 mg/dL (ref 6–24)
Bilirubin Total: 0.2 mg/dL (ref 0.0–1.2)
CO2: 24 mmol/L (ref 20–29)
Calcium: 9.1 mg/dL (ref 8.7–10.2)
Chloride: 98 mmol/L (ref 96–106)
Creatinine, Ser: 0.91 mg/dL (ref 0.57–1.00)
GFR calc Af Amer: 84 mL/min/{1.73_m2} (ref >59)
GFR calc non Af Amer: 73 mL/min/{1.73_m2} (ref >59)
Globulin, Total: 2.7 g/dL (ref 1.5–4.5)
Glucose: 117 mg/dL — ABNORMAL HIGH (ref 65–99)
Potassium: 3.4 mmol/L — ABNORMAL LOW (ref 3.5–5.2)
Sodium: 138 mmol/L (ref 134–144)
Total Protein: 7.1 g/dL (ref 6.0–8.5)

## 2020-01-14 LAB — URINALYSIS, ROUTINE W REFLEX MICROSCOPIC
Bilirubin, UA: NEGATIVE
Glucose, UA: NEGATIVE
Ketones, UA: NEGATIVE
Nitrite, UA: NEGATIVE
Protein,UA: NEGATIVE
RBC, UA: NEGATIVE
Specific Gravity, UA: <1.005 — ABNORMAL LOW (ref 1.005–1.030)
Urobilinogen, Ur: 0.2 mg/dL (ref 0.2–1.0)
pH, UA: 6 (ref 5.0–7.5)

## 2020-01-14 LAB — LIPID PANEL W/O CHOL/HDL RATIO
Cholesterol, Total: 266 mg/dL — ABNORMAL HIGH (ref 100–199)
HDL: 35 mg/dL — ABNORMAL LOW (ref >39)
LDL Chol Calc (NIH): 160 mg/dL — ABNORMAL HIGH (ref 0–99)
Triglycerides: 375 mg/dL — ABNORMAL HIGH (ref 0–149)
VLDL Cholesterol Cal: 71 mg/dL — ABNORMAL HIGH (ref 5–40)

## 2020-01-14 LAB — TSH: TSH: 1.39 u[IU]/mL (ref 0.450–4.500)

## 2020-01-14 LAB — MICROSCOPIC EXAMINATION
Bacteria, UA: NONE SEEN
RBC, Urine: NONE SEEN /hpf (ref 0–2)

## 2020-01-14 MED ORDER — LEVOTHYROXINE SODIUM 75 MCG PO TABS: ORAL_TABLET | ORAL | 3 refills | Status: DC

## 2020-01-25 ENCOUNTER — Encounter: Payer: Self-pay | Admitting: Family Medicine

## 2020-01-26 MED ORDER — LIDOCAINE 5 % EX PTCH: 1.0000 | MEDICATED_PATCH | CUTANEOUS | 0 refills | Status: DC

## 2020-02-03 DIAGNOSIS — Z1211 Encounter for screening for malignant neoplasm of colon: Secondary | ICD-10-CM | POA: Diagnosis not present

## 2020-02-03 DIAGNOSIS — Z1212 Encounter for screening for malignant neoplasm of rectum: Secondary | ICD-10-CM | POA: Diagnosis not present

## 2020-02-04 LAB — COLOGUARD: Cologuard: NEGATIVE

## 2020-02-07 LAB — COLOGUARD: COLOGUARD: NEGATIVE

## 2020-02-16 ENCOUNTER — Encounter: Payer: Self-pay | Admitting: Family Medicine

## 2020-05-14 DIAGNOSIS — U071 COVID-19: Secondary | ICD-10-CM | POA: Diagnosis not present

## 2020-06-16 ENCOUNTER — Other Ambulatory Visit: Payer: Self-pay | Admitting: Family Medicine

## 2020-06-16 MED ORDER — LIDOCAINE 5 % EX PTCH: 1.0000 | MEDICATED_PATCH | CUTANEOUS | 0 refills | Status: AC

## 2020-06-16 NOTE — Telephone Encounter (Signed)
Last seen 9/21, told to follow up in one year

## 2020-06-19 ENCOUNTER — Telehealth: Payer: Self-pay

## 2020-06-19 NOTE — Telephone Encounter (Signed)
PA for Lidocaine patches initiated and submitted via Cover My Meds. Key: [id number]

## 2020-06-21 NOTE — Telephone Encounter (Signed)
PA denied.

## 2020-08-11 ENCOUNTER — Ambulatory Visit (INDEPENDENT_AMBULATORY_CARE_PROVIDER_SITE_OTHER): Payer: BC Managed Care – PPO | Admitting: Family Medicine

## 2020-08-11 ENCOUNTER — Other Ambulatory Visit: Payer: Self-pay

## 2020-08-11 ENCOUNTER — Encounter: Payer: Self-pay | Admitting: Family Medicine

## 2020-08-11 VITALS — BP 112/78 | HR 76 | Temp 98.0°F | Wt 144.0 lb

## 2020-08-11 DIAGNOSIS — E039 Hypothyroidism, unspecified: Secondary | ICD-10-CM

## 2020-08-11 DIAGNOSIS — G8929 Other chronic pain: Secondary | ICD-10-CM

## 2020-08-11 DIAGNOSIS — R9431 Abnormal electrocardiogram [ECG] [EKG]: Secondary | ICD-10-CM

## 2020-08-11 DIAGNOSIS — M545 Low back pain, unspecified: Secondary | ICD-10-CM | POA: Diagnosis not present

## 2020-08-11 DIAGNOSIS — B351 Tinea unguium: Secondary | ICD-10-CM

## 2020-08-11 DIAGNOSIS — E782 Mixed hyperlipidemia: Secondary | ICD-10-CM | POA: Diagnosis not present

## 2020-08-11 DIAGNOSIS — M47816 Spondylosis without myelopathy or radiculopathy, lumbar region: Secondary | ICD-10-CM

## 2020-08-11 MED ORDER — CYCLOBENZAPRINE HCL 10 MG PO TABS: 10.0000 mg | ORAL_TABLET | Freq: Every day | ORAL | 2 refills | Status: DC

## 2020-08-11 MED ORDER — CICLOPIROX 8 % EX SOLN: Freq: Every day | CUTANEOUS | 12 refills | Status: DC

## 2020-08-11 NOTE — Assessment & Plan Note (Signed)
Rechecking labs today. Await results. Treat as needed.  °

## 2020-08-11 NOTE — Assessment & Plan Note (Signed)
In exacerbation. Will get X-rays of her back. May need PT vs MRI vs ortho referral await results. Treat as needed. Refills of flexeril given today.

## 2020-08-11 NOTE — Progress Notes (Signed)
BP 112/78   Pulse 76   Temp 98 F (36.7 C)   Wt 144 lb (65.3 kg)   LMP  (LMP Unknown)   SpO2 97%   BMI 26.77 kg/m    Subjective:    Patient ID: Vicki Schultz, female    DOB: 07/15/1970, 49 y.o.   MRN: [id number]  HPI: Vicki Schultz is a 50 y.o. female  Chief Complaint  Patient presents with  . Abnormal ECG    Patient had EKG done for vaping study, was told it was abnormal. Would like to get doctors opinion on EKG.    . Nail Problem    Patient states she noticed her toenails are yellow   . Back Pain    Patient states her back pain has been getting worse, Naproxen and lidocaine patches are not causing much relief.    Was going to do join a study on vaping and had an EKG for preparticipation. It was abnormal with flipped anterolateral t-waves, so she was advised to follow up here. She has been entirely asymptomatic. No CP. No SOB. No change in activity. No other concerns.   She has had slightly thickened and yellow toenails on her great toenails bilaterally.   HYPOTHYROIDISM Thyroid control status:controlled Satisfied with current treatment? yes Medication side effects: no Medication compliance: excellent compliance Recent dose adjustment:no Fatigue: no Cold intolerance: no Heat intolerance: no Weight gain: no Weight loss: no Constipation: no Diarrhea/loose stools: no Palpitations: no Lower extremity edema: no Anxiety/depressed mood: no  BACK PAIN Duration: 5-6 years Mechanism of injury: unknown Location: bilateral and low back Onset: gradual Severity: moderate Quality: aching pain Frequency: worse in the AM and with activity Radiation: none Aggravating factors: laying, bending and prolonged sitting Alleviating factors: rest, ice, heat, laying, NSAIDs, APAP and muscle relaxer Status: worse Treatments attempted: rest, ice, heat, APAP, ibuprofen and aleve  Relief with NSAIDs?: no Nighttime pain:  yes Paresthesias / decreased sensation:  no Bowel /  bladder incontinence:  no Fevers:  no Dysuria / urinary frequency:  no  Relevant past medical, surgical, family and social history reviewed and updated as indicated. Interim medical history since our last visit reviewed. Allergies and medications reviewed and updated.  Review of Systems  Constitutional: Negative.   Respiratory: Negative.   Cardiovascular: Negative.   Gastrointestinal: Negative.   Musculoskeletal: Positive for back pain and myalgias. Negative for arthralgias, gait problem, joint swelling, neck pain and neck stiffness.  Skin: Negative.   Neurological: Negative.   Psychiatric/Behavioral: Negative.     Per HPI unless specifically indicated above     Objective:    BP 112/78   Pulse 76   Temp 98 F (36.7 C)   Wt 144 lb (65.3 kg)   LMP  (LMP Unknown)   SpO2 97%   BMI 26.77 kg/m   Wt Readings from Last 3 Encounters:  08/11/20 144 lb (65.3 kg)  01/13/20 140 lb (63.5 kg)  01/11/20 140 lb (63.5 kg)    Physical Exam Vitals and nursing note reviewed.  Constitutional:      General: She is not in acute distress.    Appearance: Normal appearance. She is not ill-appearing, toxic-appearing or diaphoretic.  HENT:     Head: Normocephalic and atraumatic.     Right Ear: External ear normal.     Left Ear: External ear normal.     Nose: Nose normal.     Mouth/Throat:     Mouth: Mucous membranes are moist.  Pharynx: Oropharynx is clear.  Eyes:     General: No scleral icterus.       Right eye: No discharge.        Left eye: No discharge.     Extraocular Movements: Extraocular movements intact.     Conjunctiva/sclera: Conjunctivae normal.     Pupils: Pupils are equal, round, and reactive to light.  Cardiovascular:     Rate and Rhythm: Normal rate and regular rhythm.     Pulses: Normal pulses.     Heart sounds: Normal heart sounds. No murmur heard. No friction rub. No gallop.   Pulmonary:     Effort: Pulmonary effort is normal. No respiratory distress.      Breath sounds: Normal breath sounds. No stridor. No wheezing, rhonchi or rales.  Chest:     Chest wall: No tenderness.  Musculoskeletal:        General: Normal range of motion.     Cervical back: Normal range of motion and neck supple.  Skin:    General: Skin is warm and dry.     Capillary Refill: Capillary refill takes less than 2 seconds.     Coloration: Skin is not jaundiced or pale.     Findings: No bruising, erythema, lesion or rash.  Neurological:     General: No focal deficit present.     Mental Status: She is alert and oriented to person, place, and time. Mental status is at baseline.  Psychiatric:        Mood and Affect: Mood normal.        Behavior: Behavior normal.        Thought Content: Thought content normal.        Judgment: Judgment normal.     Results for orders placed or performed in visit on 02/09/20  Cologuard  Result Value Ref Range   Cologuard Negative Negative      Assessment & Plan:   Problem List Items Addressed This Visit      Endocrine   Hypothyroidism    Rechecking labs today. Await results. Treat as needed.       Relevant Orders   TSH   CBC with Differential/Platelet     Musculoskeletal and Integument   Degenerative joint disease (DJD) of lumbar spine    In exacerbation. Will get X-rays of her back. May need PT vs MRI vs ortho referral await results. Treat as needed. Refills of flexeril given today.       Relevant Medications   cyclobenzaprine (FLEXERIL) 10 MG tablet     Other   Mixed hyperlipidemia    Rechecking labs today. Await results. Treat as needed.       Relevant Orders   Comprehensive metabolic panel   Lipid Panel w/o Chol/HDL Ratio   CBC with Differential/Platelet    Other Visit Diagnoses    Abnormal EKG    -  Primary   Asymptomatic. Flipped t-waves in anterolateral leads x2 EKGs. Will get her into see cardiology. Call with any concerns. Warning signs to go to ER discussed.    Relevant Orders   EKG 12-Lead  (Completed)   Ambulatory referral to Cardiology   Chronic bilateral low back pain without sciatica       Will check x-rays. Await results. Treat as needed.    Relevant Medications   cyclobenzaprine (FLEXERIL) 10 MG tablet   Other Relevant Orders   DG Lumbar Spine Complete   Onychomycosis       Will start penlac. Call with any  concerns.    Relevant Medications   ciclopirox (PENLAC) 8 % solution       Follow up plan: Return in about 6 months (around 02/10/2021) for physical.

## 2020-08-12 LAB — CBC WITH DIFFERENTIAL/PLATELET
Basophils Absolute: 0.1 10*3/uL (ref 0.0–0.2)
Basos: 1 %
EOS (ABSOLUTE): 0.3 10*3/uL (ref 0.0–0.4)
Eos: 5 %
Hematocrit: 41.3 % (ref 34.0–46.6)
Hemoglobin: 14.3 g/dL (ref 11.1–15.9)
Immature Grans (Abs): 0 10*3/uL (ref 0.0–0.1)
Immature Granulocytes: 0 %
Lymphocytes Absolute: 2.5 10*3/uL (ref 0.7–3.1)
Lymphs: 42 %
MCH: 32.4 pg (ref 26.6–33.0)
MCHC: 34.6 g/dL (ref 31.5–35.7)
MCV: 94 fL (ref 79–97)
Monocytes Absolute: 0.5 10*3/uL (ref 0.1–0.9)
Monocytes: 9 %
Neutrophils Absolute: 2.6 10*3/uL (ref 1.4–7.0)
Neutrophils: 43 %
Platelets: 265 10*3/uL (ref 150–450)
RBC: 4.41 x10E6/uL (ref 3.77–5.28)
RDW: 12.6 % (ref 11.7–15.4)
WBC: 6 10*3/uL (ref 3.4–10.8)

## 2020-08-12 LAB — LIPID PANEL W/O CHOL/HDL RATIO
Cholesterol, Total: 258 mg/dL — ABNORMAL HIGH (ref 100–199)
HDL: 43 mg/dL (ref >39)
LDL Chol Calc (NIH): 167 mg/dL — ABNORMAL HIGH (ref 0–99)
Triglycerides: 258 mg/dL — ABNORMAL HIGH (ref 0–149)
VLDL Cholesterol Cal: 48 mg/dL — ABNORMAL HIGH (ref 5–40)

## 2020-08-12 LAB — COMPREHENSIVE METABOLIC PANEL
ALT: 34 IU/L — ABNORMAL HIGH (ref 0–32)
AST: 31 IU/L (ref 0–40)
Albumin/Globulin Ratio: 1.7 (ref 1.2–2.2)
Albumin: 4.7 g/dL (ref 3.8–4.9)
Alkaline Phosphatase: 94 IU/L (ref 44–121)
BUN/Creatinine Ratio: 17 (ref 9–23)
BUN: 18 mg/dL (ref 6–24)
Bilirubin Total: 0.4 mg/dL (ref 0.0–1.2)
CO2: 30 mmol/L — ABNORMAL HIGH (ref 20–29)
Calcium: 9.6 mg/dL (ref 8.7–10.2)
Chloride: 98 mmol/L (ref 96–106)
Creatinine, Ser: 1.09 mg/dL — ABNORMAL HIGH (ref 0.57–1.00)
Globulin, Total: 2.8 g/dL (ref 1.5–4.5)
Glucose: 81 mg/dL (ref 65–99)
Potassium: 4 mmol/L (ref 3.5–5.2)
Sodium: 138 mmol/L (ref 134–144)
Total Protein: 7.5 g/dL (ref 6.0–8.5)
eGFR: 62 mL/min/{1.73_m2} (ref >59)

## 2020-08-12 LAB — TSH: TSH: 4.86 u[IU]/mL — ABNORMAL HIGH (ref 0.450–4.500)

## 2020-08-13 NOTE — Progress Notes (Signed)
Interpreted by me on 08/11/20. NSR at 63bpm. Flipped t-waves in V4-6

## 2020-08-15 MED ORDER — LEVOTHYROXINE SODIUM 88 MCG PO TABS: 88.0000 ug | ORAL_TABLET | Freq: Every day | ORAL | 0 refills | Status: DC

## 2020-08-15 NOTE — Addendum Note (Signed)
Addended by: Dorcas Carrow on: 08/15/2020 09:18 AM   Modules accepted: Orders

## 2020-08-25 ENCOUNTER — Other Ambulatory Visit: Payer: Self-pay

## 2020-08-25 ENCOUNTER — Ambulatory Visit
Admission: RE | Admit: 2020-08-25 | Discharge: 2020-08-25 | Disposition: A | Discharge status: Home / Self Care | Payer: BC Managed Care – PPO | Attending: Family Medicine | Admitting: Family Medicine

## 2020-08-25 ENCOUNTER — Ambulatory Visit
Admission: RE | Admit: 2020-08-25 | Discharge: 2020-08-25 | Disposition: A | Discharge status: Home / Self Care | Payer: BC Managed Care – PPO | Source: Ambulatory Visit | Attending: Family Medicine | Admitting: Family Medicine

## 2020-08-25 DIAGNOSIS — G8929 Other chronic pain: Secondary | ICD-10-CM | POA: Diagnosis not present

## 2020-08-25 DIAGNOSIS — M545 Low back pain, unspecified: Secondary | ICD-10-CM | POA: Diagnosis not present

## 2020-09-11 ENCOUNTER — Ambulatory Visit: Payer: Self-pay | Admitting: Cardiology

## 2020-09-12 ENCOUNTER — Other Ambulatory Visit: Payer: Self-pay

## 2020-09-18 ENCOUNTER — Ambulatory Visit (INDEPENDENT_AMBULATORY_CARE_PROVIDER_SITE_OTHER): Payer: BC Managed Care – PPO | Admitting: Cardiology

## 2020-09-18 ENCOUNTER — Encounter: Payer: Self-pay | Admitting: Cardiology

## 2020-09-18 ENCOUNTER — Other Ambulatory Visit: Payer: Self-pay

## 2020-09-18 VITALS — BP 100/70 | HR 77 | Ht 61.5 in | Wt 146.0 lb

## 2020-09-18 DIAGNOSIS — E7800 Pure hypercholesterolemia, unspecified: Secondary | ICD-10-CM | POA: Diagnosis not present

## 2020-09-18 DIAGNOSIS — R9431 Abnormal electrocardiogram [ECG] [EKG]: Secondary | ICD-10-CM | POA: Diagnosis not present

## 2020-09-18 NOTE — Patient Instructions (Signed)
Medication Instructions:  Please continue your current medications   *If you need a refill on your cardiac medications before your next appointment, please call your pharmacy*   Lab Work: None   Testing/Procedures: None  Follow-Up: At [facility], you and your health needs are our priority.  As part of our continuing mission to provide you with exceptional heart care, we have created designated Provider Care Teams.  These Care Teams include your primary Cardiologist (physician) and Advanced Practice Providers (APPs -  Physician Assistants and Nurse Practitioners) who all work together to provide you with the care you need, when you need it.   Your next appointment:   Please follow-up as needed, call the office to schedule an appt  In Person  Provider:   You may see [facility] or one of the following Advanced Practice Providers on your designated Care Team:    Nicolasa Ducking, NP  Eula Listen, PA-C  Marisue Ivan, PA-C  [place], [place]

## 2020-09-18 NOTE — Progress Notes (Signed)
Cardiology Office Note:    Date:  09/18/2020   ID:  Vicki Schultz, DOB Apr 26, 1971, MRN [id number]  PCP:  Dorcas Carrow, DO   [facility] Providers Cardiologist:  None     Referring MD: Dorcas Carrow, DO   Chief Complaint  Patient presents with  . New Patient (Initial Visit)    Referred my PCP for Abnormal EKG. Meds reviewed verbally with patient.   Vicki Schultz is a 50 y.o. female who is being seen today for the evaluation of abnormal EKG at the request of Dorcas Carrow, DO.   History of Present Illness:    Vicki Schultz is a 50 y.o. female with a hx of hypothyroidism, hyperlipidemia, former smoker x20 years who presents due to an abnormal EKG.  Patient saw her primary care provider on 08/11/2020 for scheduled visit. EKG obtained showed anterolateral T wave inversions.  She denies chest pain, shortness of breath at rest or with exertion.  Denies palpitations, denies any history of heart disease.  Feels well, has no concerns at this time.  Had a recent cholesterol check with elevated total and LDL cholesterol.  Past Medical History:  Diagnosis Date  . Thyroid disease     Past Surgical History:  Procedure Laterality Date  . CESAREAN SECTION    . ROTATOR CUFF REPAIR Right 08/03/14  . TUBAL LIGATION      Current Medications: Current Meds  Medication Sig  . acyclovir (ZOVIRAX) 400 MG tablet Take 1 tablet (400 mg total) by mouth 3 (three) times daily.  . ciclopirox (PENLAC) 8 % solution Apply topically at bedtime. Apply over nail and surrounding skin. Apply daily over previous coat. After seven (7) days, may remove with alcohol and continue cycle.  . cyclobenzaprine (FLEXERIL) 10 MG tablet Take 1 tablet (10 mg total) by mouth at bedtime.  Marland Kitchen EPINEPHrine 0.3 mg/0.3 mL IJ SOAJ injection INJ 1 SYRINGE IM ONCE UTD FOR 1 DOSE  . levothyroxine (SYNTHROID) 88 MCG tablet Take 1 tablet (88 mcg total) by mouth daily before breakfast.  . lidocaine (LIDODERM) 5 % Place 1  patch onto the skin daily. Remove & Discard patch within 12 hours or as directed by MD     Allergies:   Flagyl [metronidazole] and Codeine   Social History   Socioeconomic History  . Marital status: Single    Spouse name: Not on file  . Number of children: Not on file  . Years of education: Not on file  . Highest education level: Not on file  Occupational History  . Not on file  Tobacco Use  . Smoking status: Former Smoker    Packs/day: 0.50    Types: Cigarettes  . Smokeless tobacco: Never Used  . Tobacco comment: Patient Vapes  Vaping Use  . Vaping Use: Every day  Substance and Sexual Activity  . Alcohol use: Yes    Alcohol/week: 0.0 standard drinks    Comment: two or less per week  . Drug use: No  . Sexual activity: Yes  Other Topics Concern  . Not on file  Social History Narrative  . Not on file   Social Determinants of Health   Financial Resource Strain: Not on file  Food Insecurity: Not on file  Transportation Needs: Not on file  Physical Activity: Not on file  Stress: Not on file  Social Connections: Not on file     Family History: The patient's family history includes Arthritis in her sister; Breast cancer (age of  onset: 74) in her mother; Cancer in her mother; Diabetes in her father; Hypertension in her mother and sister; Mental illness in her mother; Thyroid disease in her mother.  ROS:   Please see the history of present illness.     All other systems reviewed and are negative.  EKGs/Labs/Other Studies Reviewed:    The following studies were reviewed today:   EKG:  EKG is  ordered today.  The ekg ordered today demonstrates sinus rhythm, anterolateral T wave inversions  Recent Labs: 08/11/2020: ALT 34; BUN 18; Creatinine, Ser 1.09; Hemoglobin 14.3; Platelets 265; Potassium 4.0; Sodium 138; TSH 4.860  Recent Lipid Panel    Component Value Date/Time   CHOL 258 (H) 08/11/2020 0859   TRIG 258 (H) 08/11/2020 0859   HDL 43 08/11/2020 0859   LDLCALC  167 (H) 08/11/2020 0859     Risk Assessment/Calculations:      Physical Exam:    VS:  BP 100/70 (BP Location: Left Arm, Patient Position: Sitting, Cuff Size: Normal)   Pulse 77   Ht 5' 1.5" (1.562 m)   Wt 146 lb (66.2 kg)   LMP  (LMP Unknown)   SpO2 99%   BMI 27.14 kg/m     Wt Readings from Last 3 Encounters:  09/18/20 146 lb (66.2 kg)  08/11/20 144 lb (65.3 kg)  01/13/20 140 lb (63.5 kg)     GEN:  Well nourished, well developed in no acute distress HEENT: Normal NECK: No JVD; No carotid bruits LYMPHATICS: No lymphadenopathy CARDIAC: RRR, no murmurs, rubs, gallops RESPIRATORY:  Clear to auscultation without rales, wheezing or rhonchi  ABDOMEN: Soft, non-tender, non-distended MUSCULOSKELETAL:  No edema; No deformity  SKIN: Warm and dry NEUROLOGIC:  Alert and oriented x 3 PSYCHIATRIC:  Normal affect   ASSESSMENT:    1. Abnormal EKG   2. Pure hypercholesterolemia    PLAN:    In order of problems listed above:  1. Anterolateral T wave inversions noted on EKG.  Patient denies chest pain, shortness of breath, clinically asymptomatic.  EKG findings are nonspecific, with no cardiac symptoms, additional testing not indicated.  Patient educated on cardiac symptoms.  Risk stratification discussed with patient, including obtaining a calcium score.  Patient will hold off at this time.  We will consider stress testing, additional testing if patient becomes symptomatic. 2. Hyperlipidemia, 10-year ASCVD risk 1.6%.  Not in statin benefit group.  Low-cholesterol diet advised.  Follow-up as needed     Medication Adjustments/Labs and Tests Ordered: Current medicines are reviewed at length with the patient today.  Concerns regarding medicines are outlined above.  Orders Placed This Encounter  Procedures  . EKG 12-Lead   No orders of the defined types were placed in this encounter.   Patient Instructions  Medication Instructions:  Please continue your current medications    *If you need a refill on your cardiac medications before your next appointment, please call your pharmacy*   Lab Work: None   Testing/Procedures: None  Follow-Up: At [facility], you and your health needs are our priority.  As part of our continuing mission to provide you with exceptional heart care, we have created designated Provider Care Teams.  These Care Teams include your primary Cardiologist (physician) and Advanced Practice Providers (APPs -  Physician Assistants and Nurse Practitioners) who all work together to provide you with the care you need, when you need it.   Your next appointment:   Please follow-up as needed, call the office to schedule an appt  In Person  Provider:   You may see [facility] or one of the following Advanced Practice Providers on your designated Care Team:    Nicolasa Ducking, NP  Eula Listen, PA-C  Marisue Ivan, PA-C  [facility] [place], [place]       Signed, Debbe Odea, MD  09/18/2020 4:45 PM    [facility]

## 2020-09-26 ENCOUNTER — Other Ambulatory Visit: Payer: BC Managed Care – PPO

## 2020-09-26 ENCOUNTER — Other Ambulatory Visit: Payer: Self-pay

## 2020-09-26 DIAGNOSIS — E039 Hypothyroidism, unspecified: Secondary | ICD-10-CM

## 2020-09-27 LAB — TSH: TSH: 12.5 u[IU]/mL — ABNORMAL HIGH (ref 0.450–4.500)

## 2020-10-03 ENCOUNTER — Other Ambulatory Visit: Payer: Self-pay | Admitting: Family Medicine

## 2020-10-03 DIAGNOSIS — E039 Hypothyroidism, unspecified: Secondary | ICD-10-CM

## 2020-10-03 MED ORDER — LEVOTHYROXINE SODIUM 100 MCG PO TABS: 100.0000 ug | ORAL_TABLET | Freq: Every day | ORAL | 0 refills | Status: DC

## 2020-10-19 ENCOUNTER — Telehealth: Payer: BC Managed Care – PPO | Admitting: Physician Assistant

## 2020-10-19 DIAGNOSIS — H109 Unspecified conjunctivitis: Secondary | ICD-10-CM | POA: Diagnosis not present

## 2020-10-19 MED ORDER — POLYMYXIN B-TRIMETHOPRIM 10000-0.1 UNIT/ML-% OP SOLN: 1.0000 [drp] | OPHTHALMIC | 0 refills | Status: DC

## 2020-10-19 NOTE — Progress Notes (Signed)
E-Visit for Pink Eye   We are sorry that you are not feeling well.  Here is how we plan to help!  Based on what you have shared with me it looks like you have conjunctivitis.  Conjunctivitis is a common inflammatory or infectious condition of the eye that is often referred to as "pink eye".  In most cases it is contagious (viral or bacterial). However, not all conjunctivitis requires antibiotics (ex. Allergic).  We have made appropriate suggestions for you based upon your presentation.  I have prescribed Polytrim Ophthalmic drops 1-2 drops 4 times a day times 5 days  Pink eye can be highly contagious.  It is typically spread through direct contact with secretions, or contaminated objects or surfaces that one may have touched.  Strict handwashing is suggested with soap and water is urged.  If not available, use alcohol based had sanitizer.  Avoid unnecessary touching of the eye.  If you wear contact lenses, you will need to refrain from wearing them until you see no white discharge from the eye for at least 24 hours after being on medication.  You should see symptom improvement in 1-2 days after starting the medication regimen.  Call us if symptoms are not improved in 1-2 days.  Home Care: Wash your hands often! Do not wear your contacts until you complete your treatment plan. Avoid sharing towels, bed linen, personal items with a person who has pink eye. See attention for anyone in your home with similar symptoms.  Get Help Right Away If: Your symptoms do not improve. You develop blurred or loss of vision. Your symptoms worsen (increased discharge, pain or redness)  Your e-visit answers were reviewed by a board certified advanced clinical practitioner to complete your personal care plan.  Depending on the condition, your plan could have included both over the counter or prescription medications.  If there is a problem please reply  once you have received a response from your provider.  Your  safety is important to us.  If you have drug allergies check your prescription carefully.    You can use MyChart to ask questions about today's visit, request a non-urgent call back, or ask for a work or school excuse for 24 hours related to this e-Visit. If it has been greater than 24 hours you will need to follow up with your provider, or enter a new e-Visit to address those concerns.   You will get an e-mail in the next two days asking about your experience.  I hope that your e-visit has been valuable and will speed your recovery. Thank you for using e-visits.  I provided 5 minutes of non face-to-face time during this encounter for chart review and documentation.   

## 2020-11-14 ENCOUNTER — Other Ambulatory Visit: Payer: Self-pay

## 2020-12-04 ENCOUNTER — Other Ambulatory Visit: Payer: Self-pay

## 2020-12-04 ENCOUNTER — Other Ambulatory Visit: Payer: BC Managed Care – PPO

## 2020-12-04 DIAGNOSIS — E039 Hypothyroidism, unspecified: Secondary | ICD-10-CM | POA: Diagnosis not present

## 2020-12-05 LAB — TSH: TSH: 0.151 u[IU]/mL — ABNORMAL LOW (ref 0.450–4.500)

## 2020-12-15 ENCOUNTER — Other Ambulatory Visit: Payer: Self-pay | Admitting: Family Medicine

## 2020-12-15 DIAGNOSIS — E039 Hypothyroidism, unspecified: Secondary | ICD-10-CM

## 2020-12-15 MED ORDER — LEVOTHYROXINE SODIUM 100 MCG PO TABS: 100.0000 ug | ORAL_TABLET | Freq: Every day | ORAL | 0 refills | Status: DC

## 2021-01-08 ENCOUNTER — Other Ambulatory Visit: Payer: Self-pay | Admitting: Family Medicine

## 2021-01-08 DIAGNOSIS — Z1231 Encounter for screening mammogram for malignant neoplasm of breast: Secondary | ICD-10-CM

## 2021-01-19 ENCOUNTER — Other Ambulatory Visit: Payer: Self-pay | Admitting: Family Medicine

## 2021-01-19 NOTE — Telephone Encounter (Signed)
Patient last seen 08/11/20

## 2021-01-25 ENCOUNTER — Other Ambulatory Visit: Payer: BC Managed Care – PPO

## 2021-01-25 ENCOUNTER — Other Ambulatory Visit: Payer: Self-pay

## 2021-01-25 DIAGNOSIS — E039 Hypothyroidism, unspecified: Secondary | ICD-10-CM

## 2021-01-26 ENCOUNTER — Encounter: Payer: Self-pay | Admitting: Family Medicine

## 2021-01-26 LAB — TSH: TSH: 0.287 u[IU]/mL — ABNORMAL LOW (ref 0.450–4.500)

## 2021-01-28 ENCOUNTER — Other Ambulatory Visit: Payer: Self-pay | Admitting: Family Medicine

## 2021-01-28 DIAGNOSIS — E039 Hypothyroidism, unspecified: Secondary | ICD-10-CM

## 2021-01-28 MED ORDER — LEVOTHYROXINE SODIUM 88 MCG PO TABS: 88.0000 ug | ORAL_TABLET | Freq: Every day | ORAL | 0 refills | Status: DC

## 2021-03-03 IMAGING — MG DIGITAL SCREENING BILATERAL MAMMOGRAM WITH TOMO AND CAD
8 series · 9 of 24 positions shown · non-contrast
Comparison: Previous exam(s).

CLINICAL DATA: Screening.

EXAM:
DIGITAL SCREENING BILATERAL MAMMOGRAM WITH TOMO AND CAD

[R MLO synth-2D]
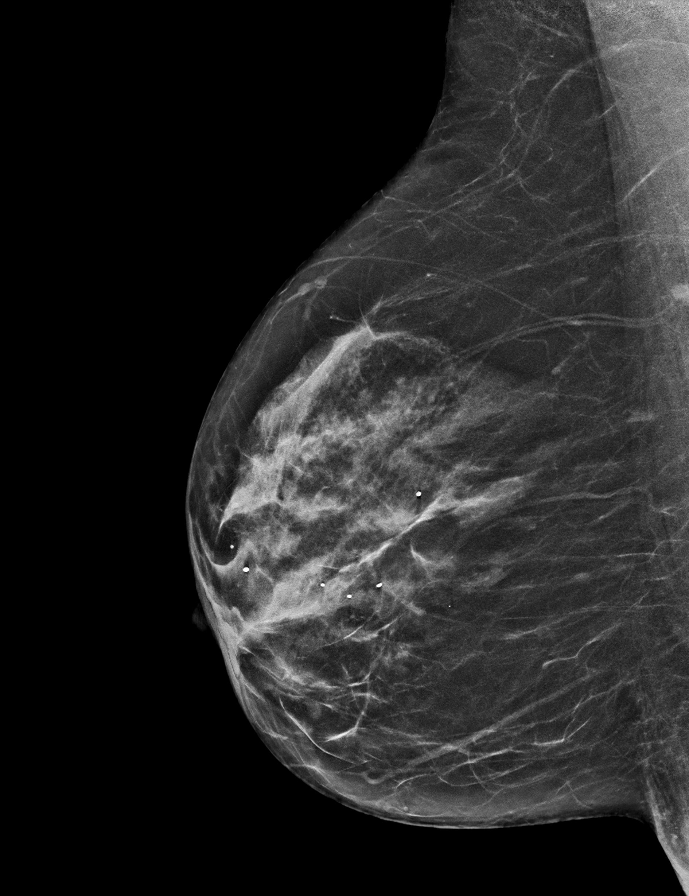

[R CC synth-2D]
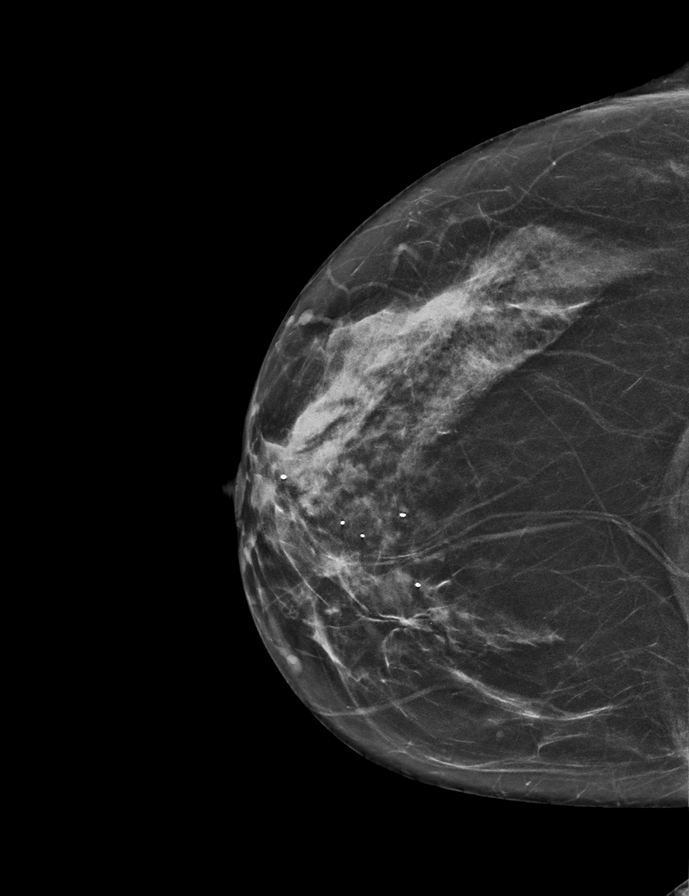

[L CC synth-2D]
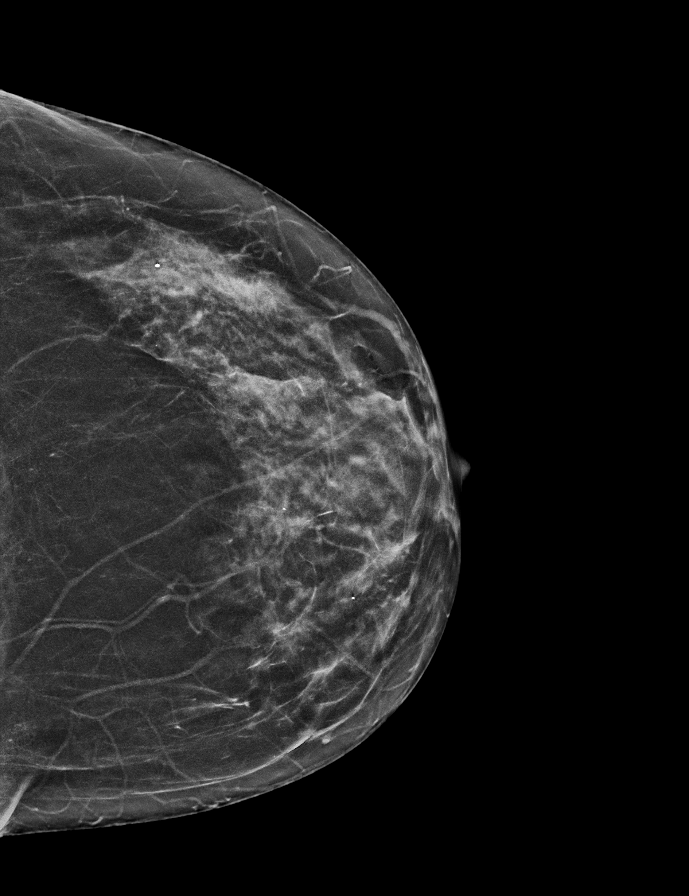

[L MLO synth-2D]
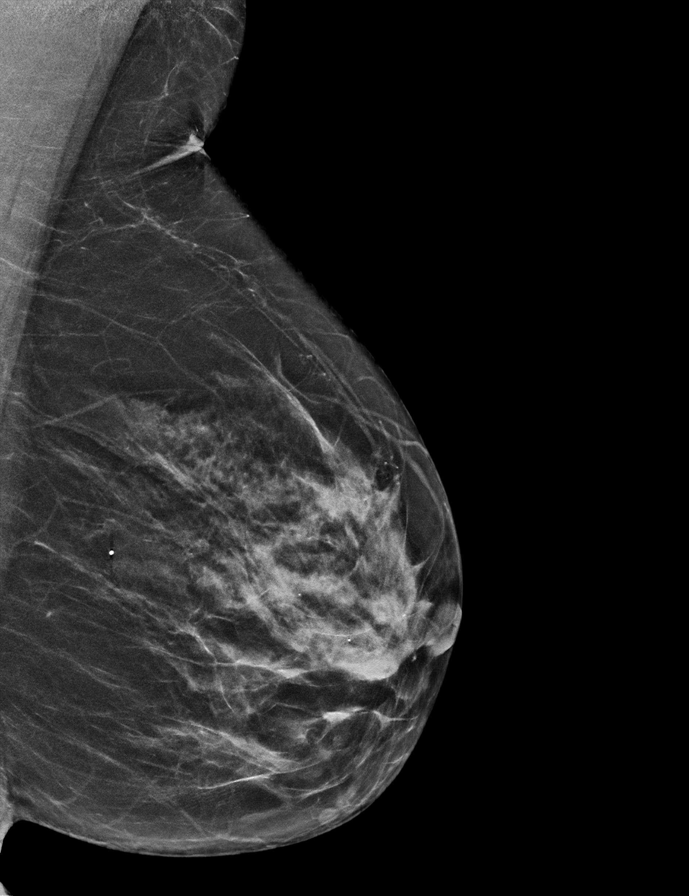

[R CC tomo · 2 of 55 frames shown]
[frame 18/55]
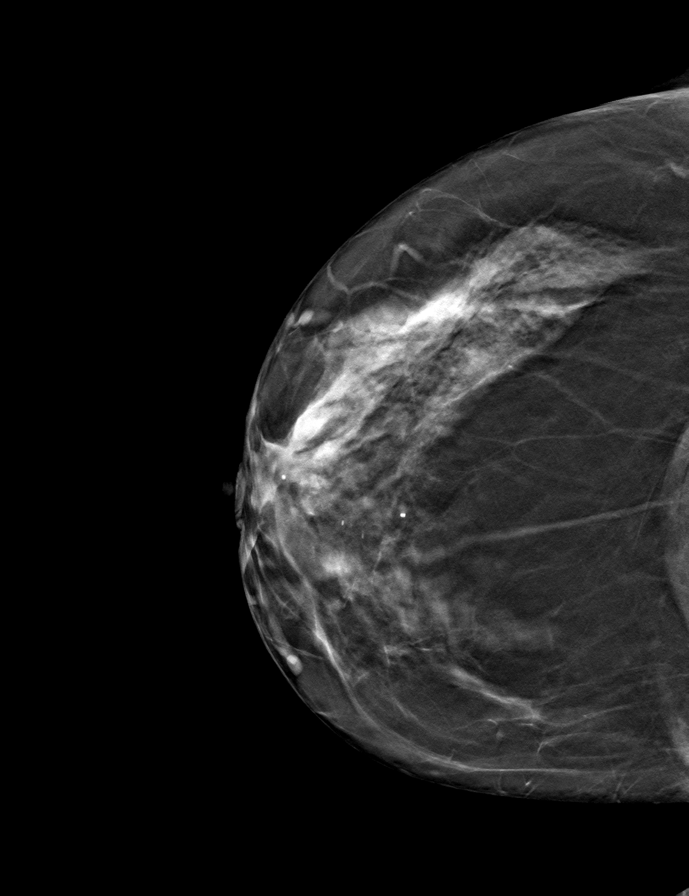
[frame 28/55]
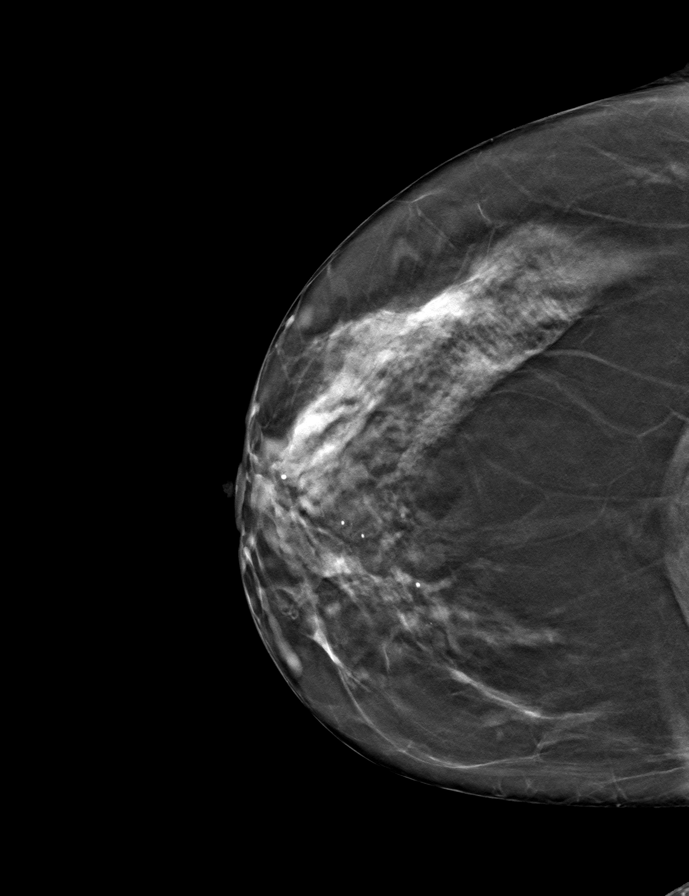

[L MLO tomo · tomo slice 27/54.0]
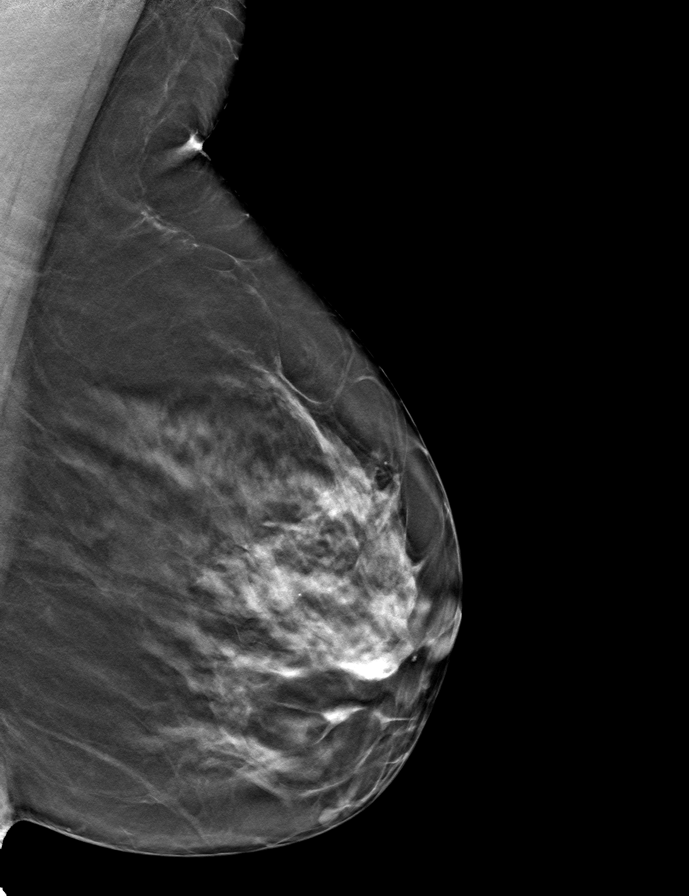

[R MLO tomo · tomo slice 27/54.0]
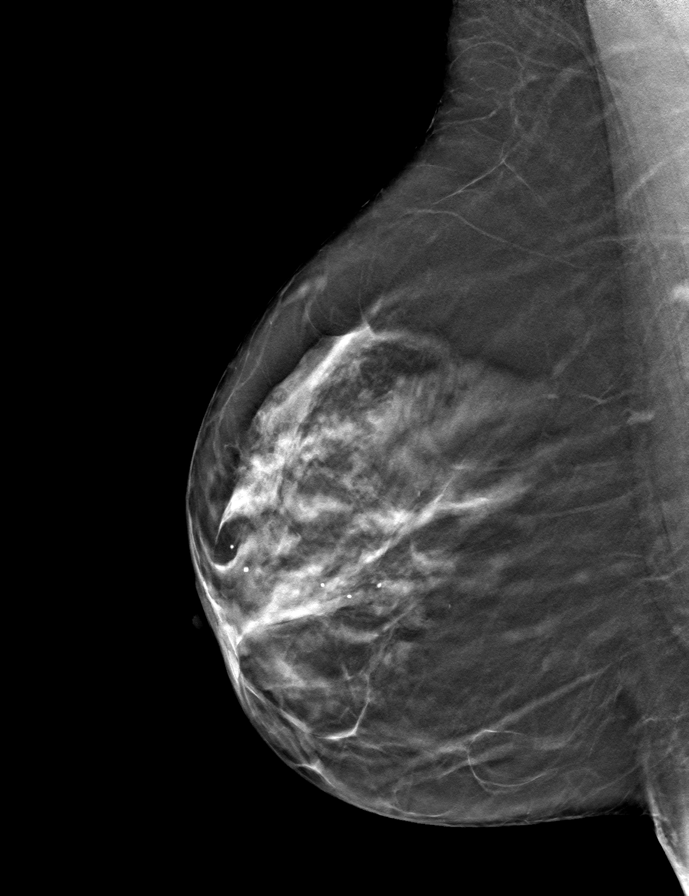

[L CC tomo · tomo slice 25/50.0]
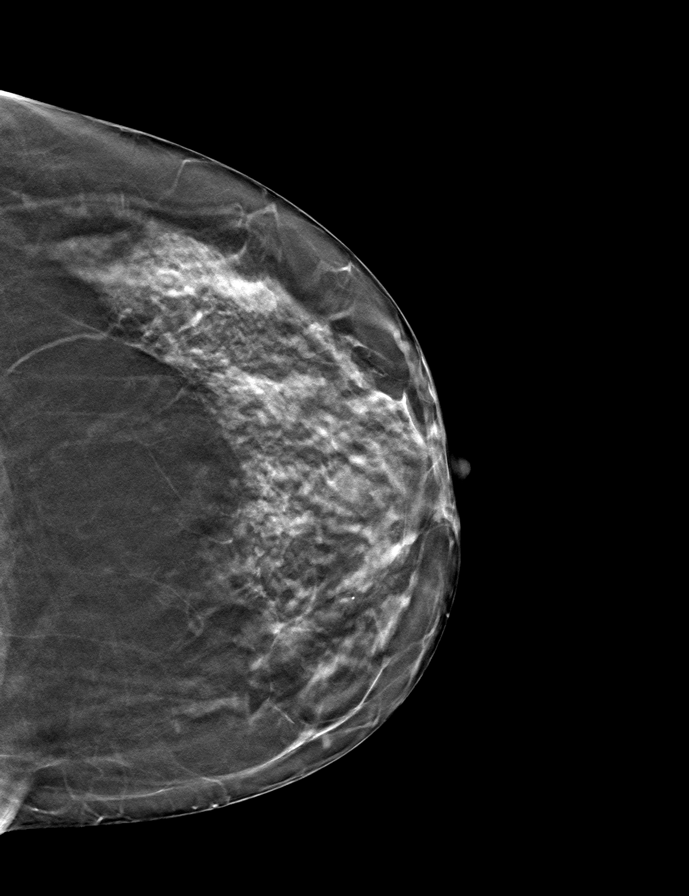

[9 of 24 positions shown; findings below may reference images not displayed]

ACR Breast Density Category c: The breast tissue is heterogeneously
dense, which may obscure small masses.
FINDINGS: There are no findings suspicious for malignancy. Images were
processed with CAD.
IMPRESSION: No mammographic evidence of malignancy. A result letter of this
screening mammogram will be mailed directly to the patient.

RECOMMENDATION:
Screening mammogram in one year. (Code:FT-U-LHB)

BI-RADS CATEGORY  1: Negative.

## 2021-04-23 ENCOUNTER — Encounter: Payer: Self-pay | Admitting: Family Medicine

## 2021-04-24 ENCOUNTER — Other Ambulatory Visit: Payer: Self-pay

## 2021-04-24 ENCOUNTER — Ambulatory Visit (INDEPENDENT_AMBULATORY_CARE_PROVIDER_SITE_OTHER): Payer: BC Managed Care – PPO | Admitting: Family Medicine

## 2021-04-24 ENCOUNTER — Encounter: Payer: Self-pay | Admitting: Family Medicine

## 2021-04-24 ENCOUNTER — Ambulatory Visit: Payer: BC Managed Care – PPO | Admitting: Family Medicine

## 2021-04-24 VITALS — BP 118/76 | HR 64 | Temp 98.0°F | Wt 145.4 lb

## 2021-04-24 DIAGNOSIS — M5432 Sciatica, left side: Secondary | ICD-10-CM | POA: Diagnosis not present

## 2021-04-24 DIAGNOSIS — G2581 Restless legs syndrome: Secondary | ICD-10-CM

## 2021-04-24 DIAGNOSIS — G4700 Insomnia, unspecified: Secondary | ICD-10-CM | POA: Diagnosis not present

## 2021-04-24 DIAGNOSIS — E039 Hypothyroidism, unspecified: Secondary | ICD-10-CM

## 2021-04-24 MED ORDER — TRAZODONE HCL 50 MG PO TABS: 25.0000 mg | ORAL_TABLET | Freq: Every evening | ORAL | 3 refills | Status: DC | PRN

## 2021-04-24 MED ORDER — CYCLOBENZAPRINE HCL 10 MG PO TABS: ORAL_TABLET | ORAL | 2 refills | Status: DC

## 2021-04-24 MED ORDER — NAPROXEN 500 MG PO TABS: 500.0000 mg | ORAL_TABLET | Freq: Two times a day (BID) | ORAL | 1 refills | Status: DC

## 2021-04-24 NOTE — Progress Notes (Signed)
BP 118/76    Pulse 64    Temp 98 F (36.7 C)    Wt 145 lb 6.4 oz (66 kg)    LMP  (LMP Unknown)    SpO2 99%    BMI 27.03 kg/m    Subjective:    Patient ID: Vicki Schultz, female    DOB: [birth year], 50 y.o.   MRN: [id number]  HPI: Vicki Schultz is a 50 y.o. female  Chief Complaint  Patient presents with   Insomnia    Patient states she has trouble mainly getting to sleep, also has trouble staying asleep. Patient has been having difficulty for about a year. Has tried otc medicine but does not help.   Pain    Patient states when she walks to fast or a far distance she gets a pain in the back of her right leg that starts in her hip.   INSOMNIA Duration: chronic Satisfied with sleep quality: no Difficulty falling asleep: yes Difficulty staying asleep: yes Waking a few hours after sleep onset: yes Early morning awakenings: no Daytime hypersomnolence: yes Wakes feeling refreshed: no Good sleep hygiene: yes Apnea: no Snoring: no Depressed/anxious mood: no Recent stress: no Restless legs/nocturnal leg cramps: yes Chronic pain/arthritis: no History of sleep study: no Treatments attempted:  ETOH, Canabis, melatonin, uinsom, and benadryl   HIP PAIN- [place] the [place] and [place] a [place] distance, then started feeling pain in her butt down to her knee Duration: 2-3 months Involved hip: left  Mechanism of injury: unknown Location: posterior Onset: sudden  Severity: moderate  Quality: aching and shooting Frequency: constant Radiation: yes Aggravating factors: walking   Alleviating factors: NSAIDs  Status: stable Treatments attempted: rest, heat, and ibuprofen   Relief with NSAIDs?: significant Weakness with weight bearing: yes Weakness with walking: yes Paresthesias / decreased sensation: yes Swelling: no Redness:no Fevers: no  Relevant past medical, surgical, family and social history reviewed and updated as indicated. Interim medical history since our last visit  reviewed. Allergies and medications reviewed and updated.  Review of Systems  Constitutional: Negative.   Respiratory: Negative.    Cardiovascular: Negative.   Musculoskeletal:  Positive for arthralgias, back pain and myalgias. Negative for gait problem, joint swelling, neck pain and neck stiffness.  Skin: Negative.   Neurological: Negative.   Psychiatric/Behavioral:  Positive for sleep disturbance. Negative for agitation, behavioral problems, confusion, decreased concentration, dysphoric mood, hallucinations, self-injury and suicidal ideas. The patient is not nervous/anxious and is not hyperactive.    Per HPI unless specifically indicated above     Objective:    BP 118/76    Pulse 64    Temp 98 F (36.7 C)    Wt 145 lb 6.4 oz (66 kg)    LMP  (LMP Unknown)    SpO2 99%    BMI 27.03 kg/m   Wt Readings from Last 3 Encounters:  04/24/21 145 lb 6.4 oz (66 kg)  09/18/20 146 lb (66.2 kg)  08/11/20 144 lb (65.3 kg)    Physical Exam Vitals and nursing note reviewed.  Constitutional:      General: She is not in acute distress.    Appearance: Normal appearance. She is not ill-appearing, toxic-appearing or diaphoretic.  HENT:     Head: Normocephalic and atraumatic.     Right Ear: External ear normal.     Left Ear: External ear normal.     Nose: Nose normal.     Mouth/Throat:     Mouth: Mucous membranes are  moist.     Pharynx: Oropharynx is clear.  Eyes:     General: No scleral icterus.       Right eye: No discharge.        Left eye: No discharge.     Extraocular Movements: Extraocular movements intact.     Conjunctiva/sclera: Conjunctivae normal.     Pupils: Pupils are equal, round, and reactive to light.  Cardiovascular:     Rate and Rhythm: Normal rate and regular rhythm.     Pulses: Normal pulses.     Heart sounds: Normal heart sounds. No murmur heard.   No friction rub. No gallop.  Pulmonary:     Effort: Pulmonary effort is normal. No respiratory distress.     Breath  sounds: Normal breath sounds. No stridor. No wheezing, rhonchi or rales.  Chest:     Chest wall: No tenderness.  Musculoskeletal:        General: Normal range of motion.     Cervical back: Normal range of motion and neck supple.  Skin:    General: Skin is warm and dry.     Capillary Refill: Capillary refill takes less than 2 seconds.     Coloration: Skin is not jaundiced or pale.     Findings: No bruising, erythema, lesion or rash.  Neurological:     General: No focal deficit present.     Mental Status: She is alert and oriented to person, place, and time. Mental status is at baseline.  Psychiatric:        Mood and Affect: Mood normal.        Behavior: Behavior normal.        Thought Content: Thought content normal.        Judgment: Judgment normal.    Results for orders placed or performed in visit on 01/25/21  TSH  Result Value Ref Range   TSH 0.287 (L) 0.450 - 4.500 uIU/mL      Assessment & Plan:   Problem List Items Addressed This Visit       Endocrine   Hypothyroidism    Rechecking labs today. Await results. Treat as needed.       Other Visit Diagnoses     RLS (restless legs syndrome)    -  Primary   Will check legs and treat as needed. Call with any concerns.    Relevant Orders   Ferritin   Comprehensive metabolic panel   CBC with Differential/Platelet   VITAMIN D 25 Hydroxy (Vit-D Deficiency, Fractures)   Left sided sciatica       Start naproxen and flexeril and exercises. Call with any concerns. Continue to monitor.    Relevant Medications   cyclobenzaprine (FLEXERIL) 10 MG tablet   traZODone (DESYREL) 50 MG tablet   Other Relevant Orders   DG Lumbar Spine Complete   Insomnia, unspecified type       Will start trazodone and look for causes. Call with any concerns. Continue to monitor.         Follow up plan: Return in about 3 months (around 07/23/2021), or physical.

## 2021-04-24 NOTE — Assessment & Plan Note (Signed)
Rechecking labs today. Await results. Treat as needed.  °

## 2021-04-25 LAB — CBC WITH DIFFERENTIAL/PLATELET
Basophils Absolute: 0.1 10*3/uL (ref 0.0–0.2)
Basos: 1 %
EOS (ABSOLUTE): 0.4 10*3/uL (ref 0.0–0.4)
Eos: 6 %
Hematocrit: 39.7 % (ref 34.0–46.6)
Hemoglobin: 13.5 g/dL (ref 11.1–15.9)
Immature Grans (Abs): 0 10*3/uL (ref 0.0–0.1)
Immature Granulocytes: 0 %
Lymphocytes Absolute: 3.2 10*3/uL — ABNORMAL HIGH (ref 0.7–3.1)
Lymphs: 45 %
MCH: 31.2 pg (ref 26.6–33.0)
MCHC: 34 g/dL (ref 31.5–35.7)
MCV: 92 fL (ref 79–97)
Monocytes Absolute: 0.4 10*3/uL (ref 0.1–0.9)
Monocytes: 6 %
Neutrophils Absolute: 3 10*3/uL (ref 1.4–7.0)
Neutrophils: 42 %
Platelets: 262 10*3/uL (ref 150–450)
RBC: 4.33 x10E6/uL (ref 3.77–5.28)
RDW: 12.5 % (ref 11.7–15.4)
WBC: 7.2 10*3/uL (ref 3.4–10.8)

## 2021-04-25 LAB — FERRITIN: Ferritin: 289 ng/mL — ABNORMAL HIGH (ref 15–150)

## 2021-04-25 LAB — COMPREHENSIVE METABOLIC PANEL
ALT: 22 IU/L (ref 0–32)
AST: 20 IU/L (ref 0–40)
Albumin/Globulin Ratio: 1.9 (ref 1.2–2.2)
Albumin: 4.5 g/dL (ref 3.8–4.9)
Alkaline Phosphatase: 92 IU/L (ref 44–121)
BUN/Creatinine Ratio: 12 (ref 9–23)
BUN: 11 mg/dL (ref 6–24)
Bilirubin Total: 0.2 mg/dL (ref 0.0–1.2)
CO2: 25 mmol/L (ref 20–29)
Calcium: 9.1 mg/dL (ref 8.7–10.2)
Chloride: 101 mmol/L (ref 96–106)
Creatinine, Ser: 0.94 mg/dL (ref 0.57–1.00)
Globulin, Total: 2.4 g/dL (ref 1.5–4.5)
Glucose: 89 mg/dL (ref 70–99)
Potassium: 3.9 mmol/L (ref 3.5–5.2)
Sodium: 138 mmol/L (ref 134–144)
Total Protein: 6.9 g/dL (ref 6.0–8.5)
eGFR: 73 mL/min/{1.73_m2} (ref >59)

## 2021-04-25 LAB — TSH: TSH: 0.286 u[IU]/mL — ABNORMAL LOW (ref 0.450–4.500)

## 2021-04-25 LAB — VITAMIN D 25 HYDROXY (VIT D DEFICIENCY, FRACTURES): Vit D, 25-Hydroxy: 16.5 ng/mL — ABNORMAL LOW (ref 30.0–100.0)

## 2021-05-01 ENCOUNTER — Other Ambulatory Visit: Payer: Self-pay | Admitting: Family Medicine

## 2021-05-01 MED ORDER — IRON (FERROUS SULFATE) 325 (65 FE) MG PO TABS: 325.0000 mg | ORAL_TABLET | Freq: Every day | ORAL | 0 refills | Status: DC

## 2021-05-01 MED ORDER — LEVOTHYROXINE SODIUM 75 MCG PO TABS: 75.0000 ug | ORAL_TABLET | Freq: Every day | ORAL | 0 refills | Status: DC

## 2021-05-01 MED ORDER — VITAMIN D (ERGOCALCIFEROL) 1.25 MG (50000 UNIT) PO CAPS: 50000.0000 [IU] | ORAL_CAPSULE | ORAL | 0 refills | Status: DC

## 2021-05-20 ENCOUNTER — Encounter: Payer: Self-pay | Admitting: Family Medicine

## 2021-06-05 ENCOUNTER — Encounter: Payer: Self-pay | Admitting: Family Medicine

## 2021-06-05 NOTE — Telephone Encounter (Signed)
Left VM asking pt to call back to schedule an appt.

## 2021-06-06 NOTE — Telephone Encounter (Signed)
Pt scheduled for 1/27

## 2021-06-08 ENCOUNTER — Ambulatory Visit: Payer: 59 | Admitting: Family Medicine

## 2021-06-08 ENCOUNTER — Encounter: Payer: Self-pay | Admitting: Family Medicine

## 2021-06-08 ENCOUNTER — Other Ambulatory Visit: Payer: Self-pay

## 2021-06-08 VITALS — BP 143/86 | HR 61 | Temp 98.0°F | Wt 144.8 lb

## 2021-06-08 DIAGNOSIS — G4700 Insomnia, unspecified: Secondary | ICD-10-CM | POA: Insufficient documentation

## 2021-06-08 DIAGNOSIS — E039 Hypothyroidism, unspecified: Secondary | ICD-10-CM

## 2021-06-08 DIAGNOSIS — E559 Vitamin D deficiency, unspecified: Secondary | ICD-10-CM | POA: Diagnosis not present

## 2021-06-08 MED ORDER — BELSOMRA 10 MG PO TABS: 10.0000 mg | ORAL_TABLET | Freq: Every evening | ORAL | 3 refills | Status: DC | PRN

## 2021-06-08 NOTE — Progress Notes (Signed)
BP (!) 143/86    Pulse 61    Temp 98 F (36.7 C)    Wt 144 lb 12.8 oz (65.7 kg)    LMP  (LMP Unknown)    SpO2 98%    BMI 26.92 kg/m    Subjective:    Patient ID: Vicki Schultz, female    DOB: 28-Jan-1971, 51 y.o.   MRN: [id number]  HPI: Vicki Schultz is a 51 y.o. female  Chief Complaint  Patient presents with   Insomnia    Patient would like to discuss medication options for sleeping    INSOMNIA Duration: chronic Satisfied with sleep quality: no Difficulty falling asleep: yes Difficulty staying asleep: yes Waking a few hours after sleep onset: no Early morning awakenings: no Daytime hypersomnolence: no Wakes feeling refreshed: no Good sleep hygiene: no Apnea: no Snoring: no Depressed/anxious mood: no Recent stress: no Restless legs/nocturnal leg cramps: no Chronic pain/arthritis: no History of sleep study: no Treatments attempted:  trazdone, melatonin, uinsom, benadryl, and ambien   Relevant past medical, surgical, family and social history reviewed and updated as indicated. Interim medical history since our last visit reviewed. Allergies and medications reviewed and updated.  Review of Systems  Constitutional: Negative.   Respiratory: Negative.    Cardiovascular: Negative.   Gastrointestinal: Negative.   Musculoskeletal: Negative.   Psychiatric/Behavioral: Negative.     Per HPI unless specifically indicated above     Objective:    BP (!) 143/86    Pulse 61    Temp 98 F (36.7 C)    Wt 144 lb 12.8 oz (65.7 kg)    LMP  (LMP Unknown)    SpO2 98%    BMI 26.92 kg/m   Wt Readings from Last 3 Encounters:  06/08/21 144 lb 12.8 oz (65.7 kg)  04/24/21 145 lb 6.4 oz (66 kg)  09/18/20 146 lb (66.2 kg)    Physical Exam Vitals and nursing note reviewed.  Constitutional:      General: She is not in acute distress.    Appearance: Normal appearance. She is not ill-appearing, toxic-appearing or diaphoretic.  HENT:     Head: Normocephalic and atraumatic.      Right Ear: External ear normal.     Left Ear: External ear normal.     Nose: Nose normal.     Mouth/Throat:     Mouth: Mucous membranes are moist.     Pharynx: Oropharynx is clear.  Eyes:     General: No scleral icterus.       Right eye: No discharge.        Left eye: No discharge.     Extraocular Movements: Extraocular movements intact.     Conjunctiva/sclera: Conjunctivae normal.     Pupils: Pupils are equal, round, and reactive to light.  Cardiovascular:     Rate and Rhythm: Normal rate and regular rhythm.     Pulses: Normal pulses.     Heart sounds: Normal heart sounds. No murmur heard.   No friction rub. No gallop.  Pulmonary:     Effort: Pulmonary effort is normal. No respiratory distress.     Breath sounds: Normal breath sounds. No stridor. No wheezing, rhonchi or rales.  Chest:     Chest wall: No tenderness.  Musculoskeletal:        General: Normal range of motion.     Cervical back: Normal range of motion and neck supple.  Skin:    General: Skin is warm and dry.  Capillary Refill: Capillary refill takes less than 2 seconds.     Coloration: Skin is not jaundiced or pale.     Findings: No bruising, erythema, lesion or rash.  Neurological:     General: No focal deficit present.     Mental Status: She is alert and oriented to person, place, and time. Mental status is at baseline.  Psychiatric:        Mood and Affect: Mood normal.        Behavior: Behavior normal.        Thought Content: Thought content normal.        Judgment: Judgment normal.    Results for orders placed or performed in visit on 04/24/21  TSH  Result Value Ref Range   TSH 0.286 (L) 0.450 - 4.500 uIU/mL  Ferritin  Result Value Ref Range   Ferritin 289 (H) 15 - 150 ng/mL  Comprehensive metabolic panel  Result Value Ref Range   Glucose 89 70 - 99 mg/dL   BUN 11 6 - 24 mg/dL   Creatinine, Ser 0.94 0.57 - 1.00 mg/dL   eGFR 73 >59 mL/min/1.73   BUN/Creatinine Ratio 12 9 - 23   Sodium 138 134  - 144 mmol/L   Potassium 3.9 3.5 - 5.2 mmol/L   Chloride 101 96 - 106 mmol/L   CO2 25 20 - 29 mmol/L   Calcium 9.1 8.7 - 10.2 mg/dL   Total Protein 6.9 6.0 - 8.5 g/dL   Albumin 4.5 3.8 - 4.9 g/dL   Globulin, Total 2.4 1.5 - 4.5 g/dL   Albumin/Globulin Ratio 1.9 1.2 - 2.2   Bilirubin Total 0.2 0.0 - 1.2 mg/dL   Alkaline Phosphatase 92 44 - 121 IU/L   AST 20 0 - 40 IU/L   ALT 22 0 - 32 IU/L  CBC with Differential/Platelet  Result Value Ref Range   WBC 7.2 3.4 - 10.8 x10E3/uL   RBC 4.33 3.77 - 5.28 x10E6/uL   Hemoglobin 13.5 11.1 - 15.9 g/dL   Hematocrit 39.7 34.0 - 46.6 %   MCV 92 79 - 97 fL   MCH 31.2 26.6 - 33.0 pg   MCHC 34.0 31.5 - 35.7 g/dL   RDW 12.5 11.7 - 15.4 %   Platelets 262 150 - 450 x10E3/uL   Neutrophils 42 Not Estab. %   Lymphs 45 Not Estab. %   Monocytes 6 Not Estab. %   Eos 6 Not Estab. %   Basos 1 Not Estab. %   Neutrophils Absolute 3.0 1.4 - 7.0 x10E3/uL   Lymphocytes Absolute 3.2 (H) 0.7 - 3.1 x10E3/uL   Monocytes Absolute 0.4 0.1 - 0.9 x10E3/uL   EOS (ABSOLUTE) 0.4 0.0 - 0.4 x10E3/uL   Basophils Absolute 0.1 0.0 - 0.2 x10E3/uL   Immature Granulocytes 0 Not Estab. %   Immature Grans (Abs) 0.0 0.0 - 0.1 x10E3/uL  VITAMIN D 25 Hydroxy (Vit-D Deficiency, Fractures)  Result Value Ref Range   Vit D, 25-Hydroxy 16.5 (L) 30.0 - 100.0 ng/mL      Assessment & Plan:   Problem List Items Addressed This Visit       Endocrine   Hypothyroidism   Relevant Orders   TSH     Other   Insomnia - Primary    Has failed OTC meds, trazodone and ambien. Will try to get belsomra approved- if we can't will try lunesta. Call with any concerns. Continue to monitor.       Other Visit Diagnoses  Vitamin D deficiency       Relevant Orders   VITAMIN D 25 Hydroxy (Vit-D Deficiency, Fractures)        Follow up plan: Return as scheduled.

## 2021-06-08 NOTE — Assessment & Plan Note (Signed)
Has failed OTC meds, trazodone and ambien. Will try to get belsomra approved- if we can't will try lunesta. Call with any concerns. Continue to monitor.

## 2021-06-09 LAB — VITAMIN D 25 HYDROXY (VIT D DEFICIENCY, FRACTURES): Vit D, 25-Hydroxy: 35.1 ng/mL (ref 30.0–100.0)

## 2021-06-09 LAB — TSH: TSH: 8.74 u[IU]/mL — ABNORMAL HIGH (ref 0.450–4.500)

## 2021-06-12 ENCOUNTER — Encounter: Payer: Self-pay | Admitting: Family Medicine

## 2021-06-12 ENCOUNTER — Other Ambulatory Visit: Payer: BC Managed Care – PPO

## 2021-06-12 NOTE — Telephone Encounter (Signed)
Can we check on the PA for belsomra please

## 2021-06-13 ENCOUNTER — Encounter: Payer: Self-pay | Admitting: Family Medicine

## 2021-06-13 MED ORDER — BELSOMRA 10 MG PO TABS: 10.0000 mg | ORAL_TABLET | Freq: Every evening | ORAL | 3 refills | Status: DC | PRN

## 2021-06-15 ENCOUNTER — Other Ambulatory Visit: Payer: Self-pay | Admitting: Family Medicine

## 2021-06-15 DIAGNOSIS — E039 Hypothyroidism, unspecified: Secondary | ICD-10-CM

## 2021-06-15 MED ORDER — LEVOTHYROXINE SODIUM 88 MCG PO TABS: 88.0000 ug | ORAL_TABLET | Freq: Every day | ORAL | 0 refills | Status: DC

## 2021-06-15 MED ORDER — MUPIROCIN CALCIUM 2 % EX CREA: 1.0000 "application " | TOPICAL_CREAM | Freq: Two times a day (BID) | CUTANEOUS | 0 refills | Status: DC

## 2021-06-15 NOTE — Telephone Encounter (Signed)
Requested medication (s) are due for refill today: no  Requested medication (s) are on the active medication list: yes  Last refill:  today  Future visit scheduled: yes  Notes to clinic:  see pharmacy note.   mupirocin ointment (BACTROBAN) 2 %       Changed from: mupirocin cream (BACTROBAN) 2 %   Sig: N/A   Disp:  Not specified    Refills:  0   Start: 06/15/2021   Class: Normal   Non-formulary   Last ordered: Today by Dorcas Carrow, DO Last refill: 06/15/2021   Rx #: [id number]   Pharmacy comment: Alternative Requested:[employer] MUPIROCIN 2% OINTMENT.   Off-Protocol Failed 06/15/2021 09:12 AM  Protocol Details  Medication not assigned to a protocol, review manually.   Valid encounter within last 12 months    This request has changes from the previous prescription.  To be filled at: [id number] - [place], [place] - [address]       Requested Prescriptions  Pending Prescriptions Disp Refills   mupirocin ointment (BACTROBAN) 2 % [Pharmacy Med Name: MUPIROCIN 2% OINTMENT]  0     Off-Protocol Failed - 06/15/2021  9:12 AM      Failed - Medication not assigned to a protocol, review manually.      Passed - Valid encounter within last 12 months    Recent Outpatient Visits           1 week ago Insomnia, unspecified type   [facility] [place], Megan P, DO   1 month ago RLS (restless legs syndrome)   [facility], Megan P, DO   10 months ago Abnormal EKG   [facility], Megan P, DO   1 year ago Routine general medical examination at a health care facility   [facility], Oralia Rud, DO   1 year ago Appointment canceled by hospital   [facility] Dorcas Carrow, DO       Future Appointments             In 1 month Laural Benes, Oralia Rud, DO [facility]

## 2021-06-25 ENCOUNTER — Encounter: Payer: Self-pay | Admitting: Family Medicine

## 2021-06-27 MED ORDER — ESZOPICLONE 1 MG PO TABS: 1.0000 mg | ORAL_TABLET | Freq: Every evening | ORAL | 1 refills | Status: DC | PRN

## 2021-06-28 NOTE — Telephone Encounter (Signed)
Can we please try a PA or find out what's going on?

## 2021-07-23 ENCOUNTER — Encounter: Payer: BC Managed Care – PPO | Admitting: Family Medicine

## 2021-07-30 ENCOUNTER — Encounter: Payer: Self-pay | Admitting: Family Medicine

## 2021-07-30 ENCOUNTER — Other Ambulatory Visit: Payer: Self-pay

## 2021-07-30 ENCOUNTER — Ambulatory Visit (INDEPENDENT_AMBULATORY_CARE_PROVIDER_SITE_OTHER): Payer: 59 | Admitting: Family Medicine

## 2021-07-30 VITALS — BP 122/79 | HR 69 | Temp 98.3°F | Ht 62.25 in | Wt 147.8 lb

## 2021-07-30 DIAGNOSIS — G4700 Insomnia, unspecified: Secondary | ICD-10-CM | POA: Diagnosis not present

## 2021-07-30 DIAGNOSIS — Z23 Encounter for immunization: Secondary | ICD-10-CM

## 2021-07-30 DIAGNOSIS — E559 Vitamin D deficiency, unspecified: Secondary | ICD-10-CM | POA: Diagnosis not present

## 2021-07-30 DIAGNOSIS — E782 Mixed hyperlipidemia: Secondary | ICD-10-CM | POA: Diagnosis not present

## 2021-07-30 DIAGNOSIS — Z0000 Encounter for general adult medical examination without abnormal findings: Secondary | ICD-10-CM

## 2021-07-30 DIAGNOSIS — E039 Hypothyroidism, unspecified: Secondary | ICD-10-CM

## 2021-07-30 LAB — URINALYSIS, ROUTINE W REFLEX MICROSCOPIC
Bilirubin, UA: NEGATIVE
Glucose, UA: NEGATIVE
Ketones, UA: NEGATIVE
Nitrite, UA: NEGATIVE
Protein,UA: NEGATIVE
RBC, UA: NEGATIVE
Specific Gravity, UA: 1.015 (ref 1.005–1.030)
Urobilinogen, Ur: 0.2 mg/dL (ref 0.2–1.0)
pH, UA: 5.5 (ref 5.0–7.5)

## 2021-07-30 MED ORDER — ACYCLOVIR 400 MG PO TABS: 400.0000 mg | ORAL_TABLET | Freq: Three times a day (TID) | ORAL | 4 refills | Status: DC

## 2021-07-30 MED ORDER — ESZOPICLONE 1 MG PO TABS: 1.0000 mg | ORAL_TABLET | Freq: Every evening | ORAL | 5 refills | Status: DC | PRN

## 2021-07-30 NOTE — Patient Instructions (Signed)
Please call to schedule your mammogram and/or bone density: ?[facility]  ?Address: [address], , [place] [address] ?Phone: [phone]  ?

## 2021-07-30 NOTE — Assessment & Plan Note (Signed)
Rechecking labs today. Await results. Treat as needed.  °

## 2021-07-30 NOTE — Progress Notes (Signed)
? ?BP 122/79   Pulse 69   Temp 98.3 ?F (36.8 ?C)   Ht 5' 2.25" (1.581 m)   Wt 147 lb 12.8 oz (67 kg)   LMP  (LMP Unknown)   SpO2 96%   BMI 26.82 kg/m?   ? ?Subjective:  ? ? Patient ID: Vicki Schultz, female    DOB: 1970/11/25, 51 y.o.   MRN: [id number] ? ?HPI: ?Vicki Schultz is a 51 y.o. female presenting on 07/30/2021 for comprehensive medical examination. Current medical complaints include: ? ?INSOMNIA ?Duration: chronic ?Satisfied with sleep quality: yes ?Difficulty falling asleep: no ?Difficulty staying asleep: no ?Waking a few hours after sleep onset: no ?Early morning awakenings: no ?Daytime hypersomnolence: no ?Wakes feeling refreshed: no ?Good sleep hygiene: yes ?Apnea: no ?Snoring: no ?Depressed/anxious mood: no ?Recent stress: no ?Restless legs/nocturnal leg cramps: no ?Chronic pain/arthritis: no ?History of sleep study: no ?Treatments attempted: melatonin, uinsom, benadryl, and ambien  ? ?HYPOTHYROIDISM ?Thyroid control status:controlled ?Satisfied with current treatment? no ?Medication side effects: no ?Medication compliance: excellent compliance ?Etiology of hypothyroidism:  ?Recent dose adjustment:no ?Fatigue: no ?Cold intolerance: no ?Heat intolerance: no ?Weight gain: no ?Weight loss: no ?Constipation: no ?Diarrhea/loose stools: no ?Palpitations: no ?Lower extremity edema: no ?Anxiety/depressed mood: no ? ?She currently lives with: husband ?Menopausal Symptoms: no ? ?Depression Screen done today and results listed below:  ?Depression screen [facility] 2/9 07/30/2021 04/24/2021 11/20/2017 10/24/2017 08/12/2017  ?Decreased Interest 0 0 0 0 0  ?Down, Depressed, Hopeless 0 0 0 0 0  ?PHQ - 2 Score 0 0 0 0 0  ?Altered sleeping 0 3 - 0 3  ?Tired, decreased energy 0 3 - 0 3  ?Change in appetite 0 0 - 0 0  ?Feeling bad or failure about yourself  0 0 - 0 0  ?Trouble concentrating 0 0 - 0 0  ?Moving slowly or fidgety/restless 0 0 - 0 -  ?Suicidal thoughts 0 0 - 0 0  ?PHQ-9 Score 0 6 - 0 6  ?Difficult doing  work/chores - - - Not difficult at all -  ? ? ?Past Medical History:  ?Past Medical History:  ?Diagnosis Date  ? Thyroid disease   ? ? ?Surgical History:  ?Past Surgical History:  ?Procedure Laterality Date  ? CESAREAN SECTION    ? ROTATOR CUFF REPAIR Right 08/03/14  ? TUBAL LIGATION    ? ? ?Medications:  ?Current Outpatient Medications on File Prior to Visit  ?Medication Sig  ? ciclopirox (PENLAC) 8 % solution Apply topically at bedtime. Apply over nail and surrounding skin. Apply daily over previous coat. After seven (7) days, may remove with alcohol and continue cycle.  ? cyclobenzaprine (FLEXERIL) 10 MG tablet TAKE 1 TABLET(10 MG) BY MOUTH AT BEDTIME  ? EPINEPHrine 0.3 mg/0.3 mL IJ SOAJ injection INJ 1 SYRINGE IM ONCE UTD FOR 1 DOSE  ? Iron, Ferrous Sulfate, 325 (65 Fe) MG TABS Take 325 mg by mouth daily.  ? levothyroxine (SYNTHROID) 88 MCG tablet Take 1 tablet (88 mcg total) by mouth daily before breakfast.  ? lidocaine (LIDODERM) 5 % Place 1 patch onto the skin daily. Remove & Discard patch within 12 hours or as directed by MD  ? mupirocin ointment (BACTROBAN) 2 % Apply topically 2 (two) times daily.  ? naproxen (NAPROSYN) 500 MG tablet Take 1 tablet (500 mg total) by mouth 2 (two) times daily with a meal.  ? Vitamin D, Ergocalciferol, (DRISDOL) 1.25 MG (50000 UNIT) CAPS capsule Take 1 capsule (50,000 Units total) by  mouth every 7 (seven) days.  ? ?No current facility-administered medications on file prior to visit.  ? ? ?Allergies:  ?Allergies  ?Allergen Reactions  ? Flagyl [Metronidazole] Anaphylaxis  ? Codeine   ?  Abdominal pain  ? Trazodone And Nefazodone Other (See Comments)  ?  depression  ? ? ?Social History:  ?Social History  ? ?Socioeconomic History  ? Marital status: Single  ?  Spouse name: Not on file  ? Number of children: Not on file  ? Years of education: Not on file  ? Highest education level: Not on file  ?Occupational History  ? Not on file  ?Tobacco Use  ? Smoking status: Former  ?   Packs/day: 0.50  ?  Types: Cigarettes  ? Smokeless tobacco: Never  ? Tobacco comments:  ?  Patient Vapes  ?Vaping Use  ? Vaping Use: Every day  ?Substance and Sexual Activity  ? Alcohol use: Yes  ?  Alcohol/week: 0.0 standard drinks  ?  Comment: two or less per week  ? Drug use: No  ? Sexual activity: Yes  ?Other Topics Concern  ? Not on file  ?Social History Narrative  ? Not on file  ? ?Social Determinants of Health  ? ?Financial Resource Strain: Not on file  ?Food Insecurity: Not on file  ?Transportation Needs: Not on file  ?Physical Activity: Not on file  ?Stress: Not on file  ?Social Connections: Not on file  ?Intimate Partner Violence: Not on file  ? ?Social History  ? ?Tobacco Use  ?Smoking Status Former  ? Packs/day: 0.50  ? Types: Cigarettes  ?Smokeless Tobacco Never  ?Tobacco Comments  ? Patient Vapes  ? ?Social History  ? ?Substance and Sexual Activity  ?Alcohol Use Yes  ? Alcohol/week: 0.0 standard drinks  ? Comment: two or less per week  ? ? ?Family History:  ?Family History  ?Problem Relation Age of Onset  ? Cancer Mother   ? Hypertension Mother   ? Mental illness Mother   ? Thyroid disease Mother   ? Breast cancer Mother 14  ? Diabetes Father   ? Arthritis Sister   ? Hypertension Sister   ? ? ?Past medical history, surgical history, medications, allergies, family history and social history reviewed with patient today and changes made to appropriate areas of the chart.  ? ?Review of Systems  ?Constitutional: Negative.   ?HENT: Negative.    ?Eyes: Negative.   ?Respiratory: Negative.    ?Cardiovascular: Negative.   ?Gastrointestinal: Negative.   ?Genitourinary: Negative.   ?Musculoskeletal: Negative.   ?Skin: Negative.   ?Neurological: Negative.   ?Endo/Heme/Allergies: Negative.   ?Psychiatric/Behavioral: Negative.    ?All other ROS negative except what is listed above and in the HPI.  ? ?   ?Objective:  ?  ?BP 122/79   Pulse 69   Temp 98.3 ?F (36.8 ?C)   Ht 5' 2.25" (1.581 m)   Wt 147 lb 12.8 oz (67  kg)   LMP  (LMP Unknown)   SpO2 96%   BMI 26.82 kg/m?   ?Wt Readings from Last 3 Encounters:  ?07/30/21 147 lb 12.8 oz (67 kg)  ?06/08/21 144 lb 12.8 oz (65.7 kg)  ?04/24/21 145 lb 6.4 oz (66 kg)  ?  ?Physical Exam ?Vitals and nursing note reviewed.  ?Constitutional:   ?   General: She is not in acute distress. ?   Appearance: Normal appearance. She is not ill-appearing, toxic-appearing or diaphoretic.  ?HENT:  ?   Head: Normocephalic and  atraumatic.  ?   Right Ear: Tympanic membrane, ear canal and external ear normal. There is no impacted cerumen.  ?   Left Ear: Tympanic membrane, ear canal and external ear normal. There is no impacted cerumen.  ?   Nose: Nose normal. No congestion or rhinorrhea.  ?   Mouth/Throat:  ?   Mouth: Mucous membranes are moist.  ?   Pharynx: Oropharynx is clear. No oropharyngeal exudate or posterior oropharyngeal erythema.  ?Eyes:  ?   General: No scleral icterus.    ?   Right eye: No discharge.     ?   Left eye: No discharge.  ?   Extraocular Movements: Extraocular movements intact.  ?   Conjunctiva/sclera: Conjunctivae normal.  ?   Pupils: Pupils are equal, round, and reactive to light.  ?Neck:  ?   Vascular: No carotid bruit.  ?Cardiovascular:  ?   Rate and Rhythm: Normal rate and regular rhythm.  ?   Pulses: Normal pulses.  ?   Heart sounds: No murmur heard. ?  No friction rub. No gallop.  ?Pulmonary:  ?   Effort: Pulmonary effort is normal. No respiratory distress.  ?   Breath sounds: Normal breath sounds. No stridor. No wheezing, rhonchi or rales.  ?Chest:  ?   Chest wall: No tenderness.  ?Abdominal:  ?   General: Abdomen is flat. Bowel sounds are normal. There is no distension.  ?   Palpations: Abdomen is soft. There is no mass.  ?   Tenderness: There is no abdominal tenderness. There is no right CVA tenderness, left CVA tenderness, guarding or rebound.  ?   Hernia: No hernia is present.  ?Genitourinary: ?   Comments: Breast and pelvic exams deferred with shared decision  making ?Musculoskeletal:     ?   General: No swelling, tenderness, deformity or signs of injury.  ?   Cervical back: Normal range of motion and neck supple. No rigidity. No muscular tenderness.  ?   Right lower leg: No edema.  ?   Lef

## 2021-07-30 NOTE — Assessment & Plan Note (Signed)
Under good control on current regimen. Continue current regimen. Continue to monitor. Call with any concerns. Refills given for 6 months. Follow up 6 months. 

## 2021-07-31 LAB — CBC WITH DIFFERENTIAL/PLATELET
Basophils Absolute: 0.1 10*3/uL (ref 0.0–0.2)
Basos: 1 %
EOS (ABSOLUTE): 0.4 10*3/uL (ref 0.0–0.4)
Eos: 5 %
Hematocrit: 39.4 % (ref 34.0–46.6)
Hemoglobin: 14.1 g/dL (ref 11.1–15.9)
Immature Grans (Abs): 0 10*3/uL (ref 0.0–0.1)
Immature Granulocytes: 0 %
Lymphocytes Absolute: 3.1 10*3/uL (ref 0.7–3.1)
Lymphs: 41 %
MCH: 33.5 pg — ABNORMAL HIGH (ref 26.6–33.0)
MCHC: 35.8 g/dL — ABNORMAL HIGH (ref 31.5–35.7)
MCV: 94 fL (ref 79–97)
Monocytes Absolute: 0.4 10*3/uL (ref 0.1–0.9)
Monocytes: 6 %
Neutrophils Absolute: 3.7 10*3/uL (ref 1.4–7.0)
Neutrophils: 47 %
Platelets: 262 10*3/uL (ref 150–450)
RBC: 4.21 x10E6/uL (ref 3.77–5.28)
RDW: 12.9 % (ref 11.7–15.4)
WBC: 7.6 10*3/uL (ref 3.4–10.8)

## 2021-07-31 LAB — COMPREHENSIVE METABOLIC PANEL
ALT: 22 IU/L (ref 0–32)
AST: 23 IU/L (ref 0–40)
Albumin/Globulin Ratio: 1.5 (ref 1.2–2.2)
Albumin: 4.3 g/dL (ref 3.8–4.9)
Alkaline Phosphatase: 96 IU/L (ref 44–121)
BUN/Creatinine Ratio: 13 (ref 9–23)
BUN: 12 mg/dL (ref 6–24)
Bilirubin Total: 0.3 mg/dL (ref 0.0–1.2)
CO2: 26 mmol/L (ref 20–29)
Calcium: 9.3 mg/dL (ref 8.7–10.2)
Chloride: 102 mmol/L (ref 96–106)
Creatinine, Ser: 0.95 mg/dL (ref 0.57–1.00)
Globulin, Total: 2.8 g/dL (ref 1.5–4.5)
Glucose: 108 mg/dL — ABNORMAL HIGH (ref 70–99)
Potassium: 3.5 mmol/L (ref 3.5–5.2)
Sodium: 141 mmol/L (ref 134–144)
Total Protein: 7.1 g/dL (ref 6.0–8.5)
eGFR: 72 mL/min/{1.73_m2} (ref >59)

## 2021-07-31 LAB — LIPID PANEL W/O CHOL/HDL RATIO
Cholesterol, Total: 247 mg/dL — ABNORMAL HIGH (ref 100–199)
HDL: 41 mg/dL (ref >39)
LDL Chol Calc (NIH): 147 mg/dL — ABNORMAL HIGH (ref 0–99)
Triglycerides: 318 mg/dL — ABNORMAL HIGH (ref 0–149)
VLDL Cholesterol Cal: 59 mg/dL — ABNORMAL HIGH (ref 5–40)

## 2021-07-31 LAB — VITAMIN D 25 HYDROXY (VIT D DEFICIENCY, FRACTURES): Vit D, 25-Hydroxy: 21.6 ng/mL — ABNORMAL LOW (ref 30.0–100.0)

## 2021-07-31 LAB — TSH: TSH: 1.06 u[IU]/mL (ref 0.450–4.500)

## 2021-08-05 ENCOUNTER — Other Ambulatory Visit: Payer: Self-pay | Admitting: Family Medicine

## 2021-08-05 MED ORDER — LEVOTHYROXINE SODIUM 88 MCG PO TABS: 88.0000 ug | ORAL_TABLET | Freq: Every day | ORAL | 3 refills | Status: DC

## 2021-08-05 MED ORDER — VITAMIN D (ERGOCALCIFEROL) 1.25 MG (50000 UNIT) PO CAPS: 50000.0000 [IU] | ORAL_CAPSULE | ORAL | 0 refills | Status: DC

## 2021-08-29 ENCOUNTER — Other Ambulatory Visit: Payer: Self-pay | Admitting: Family Medicine

## 2021-08-29 NOTE — Telephone Encounter (Signed)
Requested Prescriptions  ?Pending Prescriptions Disp Refills  ?? naproxen (NAPROSYN) 500 MG tablet [Pharmacy Med Name: NAPROXEN 500 MG TABLET] 30 tablet 1  ?  Sig: TAKE 1 TABLET BY MOUTH TWICE DAILY WITH A MEAL  ?  ? Analgesics:  NSAIDS Failed - 08/29/2021  2:34 AM  ?  ?  Failed - Manual Review: Labs are only required if the patient has taken medication for more than 8 weeks.  ?  ?  Passed - Cr in normal range and within 360 days  ?  Creatinine  ?Date Value Ref Range Status  ?11/05/2011 0.84 0.60 - 1.30 mg/dL Final  ? ?Creatinine, Ser  ?Date Value Ref Range Status  ?07/30/2021 0.95 0.57 - 1.00 mg/dL Final  ?   ?  ?  Passed - HGB in normal range and within 360 days  ?  Hemoglobin  ?Date Value Ref Range Status  ?07/30/2021 14.1 11.1 - 15.9 g/dL Final  ?   ?  ?  Passed - PLT in normal range and within 360 days  ?  Platelets  ?Date Value Ref Range Status  ?07/30/2021 262 150 - 450 x10E3/uL Final  ?   ?  ?  Passed - HCT in normal range and within 360 days  ?  Hematocrit  ?Date Value Ref Range Status  ?07/30/2021 39.4 34.0 - 46.6 % Final  ?   ?  ?  Passed - eGFR is 30 or above and within 360 days  ?  EGFR (African American)  ?Date Value Ref Range Status  ?11/05/2011 >60  Final  ? ?GFR calc Af Wyvonnia Lora  ?Date Value Ref Range Status  ?01/13/2020 84 >59 mL/min/1.73 Final  ?  Comment:  ?  **[facility] currently reports eGFR in compliance with the current** ?  recommendations of the [employer]. [facility] will ?  update reporting as new guidelines are published from the NKF-ASN ?  Task force. ?  ? ?EGFR (Non-African Amer.)  ?Date Value Ref Range Status  ?11/05/2011 >60  Final  ?  Comment:  ?  eGFR values <47m/min/1.73 m2 may be an indication of chronic ?kidney disease (CKD). ?Calculated eGFR is useful in patients with stable renal function. ?The eGFR calculation will not be reliable in acutely ill patients ?when serum creatinine is changing rapidly. It is not useful in  ?patients on dialysis. The eGFR calculation may not  be applicable ?to patients at the low and high extremes of body sizes, pregnant ?women, and vegetarians. ?  ? ?GFR calc non Af Amer  ?Date Value Ref Range Status  ?01/13/2020 73 >59 mL/min/1.73 Final  ? ?eGFR  ?Date Value Ref Range Status  ?07/30/2021 72 >59 mL/min/1.73 Final  ?   ?  ?  Passed - Patient is not pregnant  ?  ?  Passed - Valid encounter within last 12 months  ?  Recent Outpatient Visits   ?      ? 1 month ago Routine general medical examination at a health care facility  ? C[place]P, DO  ? 2 months ago Insomnia, unspecified type  ? CC[place] Megan P, DO  ? 4 months ago RLS (restless legs syndrome)  ? CPotter Megan P, DO  ? 1 year ago Abnormal EKG  ? CMalo DO  ? 1 year ago Routine general medical examination at a health care facility  ? [place], [place]  ?  ?  ?  Future Appointments   ?        ? In 5 months Wynetta Emery, Barb Merino, DO [facility]  ?  ? ?  ?  ?  ? ? ?

## 2021-09-23 ENCOUNTER — Other Ambulatory Visit: Payer: Self-pay | Admitting: Family Medicine

## 2021-09-25 NOTE — Telephone Encounter (Signed)
Requested Prescriptions  ?Pending Prescriptions Disp Refills  ?? naproxen (NAPROSYN) 500 MG tablet [Pharmacy Med Name: NAPROXEN 500 MG TABLET] 30 tablet 1  ?  Sig: TAKE 1 TABLET BY MOUTH TWICE DAILY WITH A MEAL  ?  ? Analgesics:  NSAIDS Failed - 09/23/2021 12:38 PM  ?  ?  Failed - Manual Review: Labs are only required if the patient has taken medication for more than 8 weeks.  ?  ?  Passed - Cr in normal range and within 360 days  ?  Creatinine  ?Date Value Ref Range Status  ?11/05/2011 0.84 0.60 - 1.30 mg/dL Final  ? ?Creatinine, Ser  ?Date Value Ref Range Status  ?07/30/2021 0.95 0.57 - 1.00 mg/dL Final  ?   ?  ?  Passed - HGB in normal range and within 360 days  ?  Hemoglobin  ?Date Value Ref Range Status  ?07/30/2021 14.1 11.1 - 15.9 g/dL Final  ?   ?  ?  Passed - PLT in normal range and within 360 days  ?  Platelets  ?Date Value Ref Range Status  ?07/30/2021 262 150 - 450 x10E3/uL Final  ?   ?  ?  Passed - HCT in normal range and within 360 days  ?  Hematocrit  ?Date Value Ref Range Status  ?07/30/2021 39.4 34.0 - 46.6 % Final  ?   ?  ?  Passed - eGFR is 30 or above and within 360 days  ?  EGFR (African American)  ?Date Value Ref Range Status  ?11/05/2011 >60  Final  ? ?GFR calc Af Wyvonnia Lora  ?Date Value Ref Range Status  ?01/13/2020 84 >59 mL/min/1.73 Final  ?  Comment:  ?  **[facility] currently reports eGFR in compliance with the current** ?  recommendations of the [employer]. [facility] will ?  update reporting as new guidelines are published from the NKF-ASN ?  Task force. ?  ? ?EGFR (Non-African Amer.)  ?Date Value Ref Range Status  ?11/05/2011 >60  Final  ?  Comment:  ?  eGFR values <20m/min/1.73 m2 may be an indication of chronic ?kidney disease (CKD). ?Calculated eGFR is useful in patients with stable renal function. ?The eGFR calculation will not be reliable in acutely ill patients ?when serum creatinine is changing rapidly. It is not useful in  ?patients on dialysis. The eGFR calculation may not  be applicable ?to patients at the low and high extremes of body sizes, pregnant ?women, and vegetarians. ?  ? ?GFR calc non Af Amer  ?Date Value Ref Range Status  ?01/13/2020 73 >59 mL/min/1.73 Final  ? ?eGFR  ?Date Value Ref Range Status  ?07/30/2021 72 >59 mL/min/1.73 Final  ?   ?  ?  Passed - Patient is not pregnant  ?  ?  Passed - Valid encounter within last 12 months  ?  Recent Outpatient Visits   ?      ? 1 month ago Routine general medical examination at a health care facility  ? C[place]P, DO  ? 3 months ago Insomnia, unspecified type  ? CB[place] Megan P, DO  ? 5 months ago RLS (restless legs syndrome)  ? CN[place] Megan P, DO  ? 1 year ago Abnormal EKG  ? CB[place] DO  ? 1 year ago Routine general medical examination at a health care facility  ? [place], DO  ?  ?  ?  Future Appointments   ?        ? In 4 months Wynetta Emery, Barb Merino, DO [facility]  ?  ? ?  ?  ?  ? ? ?

## 2021-09-30 ENCOUNTER — Other Ambulatory Visit: Payer: Self-pay | Admitting: Family Medicine

## 2021-10-02 NOTE — Telephone Encounter (Signed)
Requested medication (s) are due for refill today: yes  Requested medication (s) are on the active medication list: yes  Last refill:  04/24/21 #30/2  Future visit scheduled: yes  Notes to clinic:  Unable to refill per protocol, cannot delegate.   Requested Prescriptions  Pending Prescriptions Disp Refills   cyclobenzaprine (FLEXERIL) 10 MG tablet [Pharmacy Med Name: CYCLOBENZAPRINE 10 MG TABLET] 30 tablet 2    Sig: TAKE 1 TABLET BY MOUTH AT BEDTIME     Not Delegated - Analgesics:  Muscle Relaxants Failed - 09/30/2021  9:11 PM      Failed - This refill cannot be delegated      Passed - Valid encounter within last 6 months    Recent Outpatient Visits           2 months ago Routine general medical examination at a health care facility   [facility] [place] P, DO   3 months ago Insomnia, unspecified type   [facility], Megan P, DO   5 months ago RLS (restless legs syndrome)   [facility] [place], Megan P, DO   1 year ago Abnormal EKG   [facility] [place], Megan P, DO   1 year ago Routine general medical examination at a health care facility   [facility] [place], Oralia Rud, DO       Future Appointments             In 4 months Laural Benes, Oralia Rud, DO [facility]

## 2021-10-30 ENCOUNTER — Ambulatory Visit (INDEPENDENT_AMBULATORY_CARE_PROVIDER_SITE_OTHER): Payer: 59

## 2021-10-30 DIAGNOSIS — Z23 Encounter for immunization: Secondary | ICD-10-CM

## 2021-11-11 ENCOUNTER — Other Ambulatory Visit: Payer: Self-pay | Admitting: Family Medicine

## 2021-11-12 NOTE — Telephone Encounter (Signed)
Requested medication (s) are due for refill today - yes  Requested medication (s) are on the active medication list -yes  Future visit scheduled -yes  Last refill: Vit D 08/05/21 #12                  Iron 05/01/21 #90  Notes to clinic: High dose Vit D- sent for review, Iron -labs WNL/high- sent for review  Requested Prescriptions  Pending Prescriptions Disp Refills   Vitamin D, Ergocalciferol, (DRISDOL) 1.25 MG (50000 UNIT) CAPS capsule [Pharmacy Med Name: VITAMIN D2 1.25MG (50,000 UNIT)] 12 capsule 0    Sig: Take 1 capsule (50,000 Units total) by mouth every 7 (seven) days.     Endocrinology:  Vitamins - Vitamin D Supplementation 2 Failed - 11/11/2021  1:28 PM      Failed - Manual Review: Route requests for 50,000 IU strength to the provider      Failed - Vitamin D in normal range and within 360 days    Vit D, 25-Hydroxy  Date Value Ref Range Status  07/30/2021 21.6 (L) 30.0 - 100.0 ng/mL Final    Comment:    Vitamin D deficiency has been defined by the Institute of Medicine and an Endocrine Society practice guideline as a level of serum 25-OH vitamin D less than 20 ng/mL (1,2). The Endocrine Society went on to further define vitamin D insufficiency as a level between 21 and 29 ng/mL (2). 1. IOM (Institute of Medicine). 2010. Dietary reference    intakes for calcium and D. [place]: The    [employer]. 2. Holick MF, Binkley [place], Bischoff-Ferrari HA, et al.    Evaluation, treatment, and prevention of vitamin D    deficiency: an Endocrine Society clinical practice    guideline. JCEM. 2011 Jul; 96(7):1911-30.          Passed - Ca in normal range and within 360 days    Calcium  Date Value Ref Range Status  07/30/2021 9.3 8.7 - 10.2 mg/dL Final   Calcium, Total  Date Value Ref Range Status  11/05/2011 8.3 (L) 8.5 - 10.1 mg/dL Final         Passed - Valid encounter within last 12 months    Recent Outpatient Visits           3 months ago Routine general  medical examination at a health care facility   [facility], [place] P, DO   5 months ago Insomnia, unspecified type   [facility], Megan P, DO   6 months ago RLS (restless legs syndrome)   [facility] [place], Megan P, DO   1 year ago Abnormal EKG   [facility] [place], Megan P, DO   1 year ago Routine general medical examination at a health care facility   [facility], [place] P, DO       Future Appointments             In 2 months Johnson, Megan P, DO [facility]             ferrous sulfate 325 (65 FE) MG tablet [Pharmacy Med Name: FERROUS SULFATE 325 MG TABLET] 90 tablet 0    Sig: TAKE 1 TABLET BY MOUTH DAILY     Endocrinology:  Minerals - Iron Supplementation Failed - 11/11/2021  1:28 PM      Failed - Fe (serum) in normal range and within 360 days    No results found  for: "IRON", "IRONPCTSAT"       Failed - Ferritin in normal range and within 360 days    Ferritin  Date Value Ref Range Status  04/24/2021 289 (H) 15 - 150 ng/mL Final         Passed - HGB in normal range and within 360 days    Hemoglobin  Date Value Ref Range Status  07/30/2021 14.1 11.1 - 15.9 g/dL Final         Passed - HCT in normal range and within 360 days    Hematocrit  Date Value Ref Range Status  07/30/2021 39.4 34.0 - 46.6 % Final         Passed - RBC in normal range and within 360 days    RBC  Date Value Ref Range Status  07/30/2021 4.21 3.77 - 5.28 x10E6/uL Final  11/05/2011 3.90 3.80 - 5.20 X10 6/mm 3 Final         Passed - Valid encounter within last 12 months    Recent Outpatient Visits           3 months ago Routine general medical examination at a health care facility   [facility] [place] P, DO   5 months ago Insomnia, unspecified type   [facility], [facility], DO   6 months ago RLS (restless legs syndrome)   [facility] [place], Megan P, DO   1 year ago Abnormal EKG   [facility] [place], Megan P, DO   1 year ago Routine general medical examination at a health care facility   [facility], [place] P, DO       Future Appointments             In 2 months Johnson, Megan P, DO [facility]               Requested Prescriptions  Pending Prescriptions Disp Refills   Vitamin D, Ergocalciferol, (DRISDOL) 1.25 MG (50000 UNIT) CAPS capsule [Pharmacy Med Name: VITAMIN D2 1.25MG (50,000 UNIT)] 12 capsule 0    Sig: Take 1 capsule (50,000 Units total) by mouth every 7 (seven) days.     Endocrinology:  Vitamins - Vitamin D Supplementation 2 Failed - 11/11/2021  1:28 PM      Failed - Manual Review: Route requests for 50,000 IU strength to the provider      Failed - Vitamin D in normal range and within 360 days    Vit D, 25-Hydroxy  Date Value Ref Range Status  07/30/2021 21.6 (L) 30.0 - 100.0 ng/mL Final    Comment:    Vitamin D deficiency has been defined by the Institute of Medicine and an Endocrine Society practice guideline as a level of serum 25-OH vitamin D less than 20 ng/mL (1,2). The Endocrine Society went on to further define vitamin D insufficiency as a level between 21 and 29 ng/mL (2). 1. IOM (Institute of Medicine). 2010. Dietary reference    intakes for calcium and D. [place]: The    [employer]. 2. Holick MF, Binkley [place], Bischoff-Ferrari HA, et al.    Evaluation, treatment, and prevention of vitamin D    deficiency: an Endocrine Society clinical practice    guideline. JCEM. 2011 Jul; 96(7):1911-30.          Passed - Ca in normal range and within 360 days    Calcium  Date Value Ref Range Status  07/30/2021 9.3 8.7 - 10.2  mg/dL Final   Calcium, Total  Date Value Ref Range Status  11/05/2011 8.3 (L) 8.5 - 10.1 mg/dL Final         Passed - Valid encounter within last 12 months    Recent Outpatient Visits            3 months ago Routine general medical examination at a health care facility   [facility], [place] P, DO   5 months ago Insomnia, unspecified type   [facility], [facility] P, DO   6 months ago RLS (restless legs syndrome)   [facility] [place], Megan P, DO   1 year ago Abnormal EKG   [facility] [place], Megan P, DO   1 year ago Routine general medical examination at a health care facility   [facility], [place] P, DO       Future Appointments             In 2 months Johnson, Megan P, DO [facility]             ferrous sulfate 325 (65 FE) MG tablet [Pharmacy Med Name: FERROUS SULFATE 325 MG TABLET] 90 tablet 0    Sig: TAKE 1 TABLET BY MOUTH DAILY     Endocrinology:  Minerals - Iron Supplementation Failed - 11/11/2021  1:28 PM      Failed - Fe (serum) in normal range and within 360 days    No results found for: "IRON", "IRONPCTSAT"       Failed - Ferritin in normal range and within 360 days    Ferritin  Date Value Ref Range Status  04/24/2021 289 (H) 15 - 150 ng/mL Final         Passed - HGB in normal range and within 360 days    Hemoglobin  Date Value Ref Range Status  07/30/2021 14.1 11.1 - 15.9 g/dL Final         Passed - HCT in normal range and within 360 days    Hematocrit  Date Value Ref Range Status  07/30/2021 39.4 34.0 - 46.6 % Final         Passed - RBC in normal range and within 360 days    RBC  Date Value Ref Range Status  07/30/2021 4.21 3.77 - 5.28 x10E6/uL Final  11/05/2011 3.90 3.80 - 5.20 X10 6/mm 3 Final         Passed - Valid encounter within last 12 months    Recent Outpatient Visits           3 months ago Routine general medical examination at a health care facility   [facility], [place] P, DO   5 months ago Insomnia, unspecified type   [facility] [place], Megan P, DO   6 months ago RLS  (restless legs syndrome)   [facility] [place], Megan P, DO   1 year ago Abnormal EKG   [facility] [place], Megan P, DO   1 year ago Routine general medical examination at a health care facility   [facility] [place], Oralia Rud, DO       Future Appointments             In 2 months Laural Benes, Oralia Rud, DO [facility]

## 2021-11-15 ENCOUNTER — Other Ambulatory Visit: Payer: Self-pay | Admitting: Family Medicine

## 2021-11-15 NOTE — Telephone Encounter (Signed)
Requested medication (s) are due for refill today - no  Requested medication (s) are on the active medication list -yes  Future visit scheduled -yes  Last refill: 08/09/21 #30 5RF  Notes to clinic: non delegated Rx  Requested Prescriptions  Pending Prescriptions Disp Refills   eszopiclone (LUNESTA) 1 MG TABS tablet [Pharmacy Med Name: ESZOPICLONE 1 MG TABLET] 30 tablet 1    Sig: Take 1 tablet (1 mg total) by mouth at bedtime as needed for sleep. Take immediately before bedtime     Not Delegated - Psychiatry:  Anxiolytics/Hypnotics Failed - 11/15/2021  3:46 PM      Failed - This refill cannot be delegated      Failed - Urine Drug Screen completed in last 360 days      Passed - Valid encounter within last 6 months    Recent Outpatient Visits           3 months ago Routine general medical examination at a health care facility   [facility], [place] P, DO   5 months ago Insomnia, unspecified type   [facility], [facility]   6 months ago RLS (restless legs syndrome)   [facility] [place], Megan P, DO   1 year ago Abnormal EKG   [facility] [place], Megan P, DO   1 year ago Routine general medical examination at a health care facility   [facility], [place] P, DO       Future Appointments             In 2 months Johnson, Megan P, DO [facility]               Requested Prescriptions  Pending Prescriptions Disp Refills   eszopiclone (LUNESTA) 1 MG TABS tablet [Pharmacy Med Name: ESZOPICLONE 1 MG TABLET] 30 tablet 1    Sig: Take 1 tablet (1 mg total) by mouth at bedtime as needed for sleep. Take immediately before bedtime     Not Delegated - Psychiatry:  Anxiolytics/Hypnotics Failed - 11/15/2021  3:46 PM      Failed - This refill cannot be delegated      Failed - Urine Drug Screen completed in last 360 days      Passed - Valid encounter within last 6 months     Recent Outpatient Visits           3 months ago Routine general medical examination at a health care facility   [facility], [place] P, DO   5 months ago Insomnia, unspecified type   [facility], [facility] P, DO   6 months ago RLS (restless legs syndrome)   [facility] [place], [facility] P, DO   1 year ago Abnormal EKG   [facility] [place], Megan P, DO   1 year ago Routine general medical examination at a health care facility   [facility] [place], Oralia Rud, DO       Future Appointments             In 2 months Laural Benes, Oralia Rud, DO [facility]

## 2021-12-05 ENCOUNTER — Other Ambulatory Visit: Payer: Self-pay | Admitting: Family Medicine

## 2021-12-06 NOTE — Telephone Encounter (Signed)
Requested medication (s) are due for refill today: yes  Requested medication (s) are on the active medication list: yes  Last refill:  11/12/21  Future visit scheduled: yes  Notes to clinic:  Unable to refill per protocol, cannot delegate,Route requests for 50,000 IU strength to the provider     Requested Prescriptions  Pending Prescriptions Disp Refills   Vitamin D, Ergocalciferol, (DRISDOL) 1.25 MG (50000 UNIT) CAPS capsule [Pharmacy Med Name: VITAMIN D2 1.25MG (50,000 UNIT)] 4 capsule 2    Sig: TAKE 1 CAPSULE (50,000 UNITS TOTAL) BY MOUTH EVERY 7 (SEVEN) DAYS     Endocrinology:  Vitamins - Vitamin D Supplementation 2 Failed - 12/05/2021 12:31 PM      Failed - Manual Review: Route requests for 50,000 IU strength to the provider      Failed - Vitamin D in normal range and within 360 days    Vit D, 25-Hydroxy  Date Value Ref Range Status  07/30/2021 21.6 (L) 30.0 - 100.0 ng/mL Final    Comment:    Vitamin D deficiency has been defined by the Institute of Medicine and an Endocrine Society practice guideline as a level of serum 25-OH vitamin D less than 20 ng/mL (1,2). The Endocrine Society went on to further define vitamin D insufficiency as a level between 21 and 29 ng/mL (2). 1. IOM (Institute of Medicine). 2010. Dietary reference    intakes for calcium and D. [place]: The    [employer]. 2. Holick MF, Binkley [place], Bischoff-Ferrari HA, et al.    Evaluation, treatment, and prevention of vitamin D    deficiency: an Endocrine Society clinical practice    guideline. JCEM. 2011 Jul; 96(7):1911-30.          Passed - Ca in normal range and within 360 days    Calcium  Date Value Ref Range Status  07/30/2021 9.3 8.7 - 10.2 mg/dL Final   Calcium, Total  Date Value Ref Range Status  11/05/2011 8.3 (L) 8.5 - 10.1 mg/dL Final         Passed - Valid encounter within last 12 months    Recent Outpatient Visits           4 months ago Routine general medical  examination at a health care facility   [facility], [place] P, DO   6 months ago Insomnia, unspecified type   [facility], [facility] P, DO   7 months ago RLS (restless legs syndrome)   [facility] [place], Megan P, DO   1 year ago Abnormal EKG   [facility] [place], Megan P, DO   1 year ago Routine general medical examination at a health care facility   [facility], Oralia Rud, DO       Future Appointments             In 1 month Laural Benes, Oralia Rud, DO [facility]

## 2021-12-08 ENCOUNTER — Other Ambulatory Visit: Payer: Self-pay | Admitting: Family Medicine

## 2021-12-10 NOTE — Telephone Encounter (Signed)
Requested medication (s) are due for refill today: NO, filled 30 day supply 10 days ago.  Requested medication (s) are on the active medication list: yes  Last refill:  10/02/21 #30 with 2 RF  Future visit scheduled: 01/2022, seen 07/30/21  Notes to clinic:  This medication can not be delegated, please assess.        Requested Prescriptions  Pending Prescriptions Disp Refills   cyclobenzaprine (FLEXERIL) 10 MG tablet [Pharmacy Med Name: CYCLOBENZAPRINE 10 MG TABLET] 30 tablet 2    Sig: TAKE 1 TABLET BY MOUTH EVERYDAY AT BEDTIME     Not Delegated - Analgesics:  Muscle Relaxants Failed - 12/08/2021 11:24 AM      Failed - This refill cannot be delegated      Passed - Valid encounter within last 6 months    Recent Outpatient Visits           4 months ago Routine general medical examination at a health care facility   [facility] [place], [place] P, DO   6 months ago Insomnia, unspecified type   [facility], [facility] P, DO   7 months ago RLS (restless legs syndrome)   [facility] [place], Megan P, DO   1 year ago Abnormal EKG   [facility] [place], Megan P, DO   1 year ago Routine general medical examination at a health care facility   [facility] [place], Oralia Rud, DO       Future Appointments             In 1 month Laural Benes, Oralia Rud, DO [facility]

## 2021-12-17 ENCOUNTER — Other Ambulatory Visit: Payer: Self-pay | Admitting: Family Medicine

## 2021-12-18 NOTE — Telephone Encounter (Signed)
Requested medication (s) are due for refill today:   No  Requested medication (s) are on the active medication list:   Yes  Future visit scheduled:   Yes   Last ordered: 11/12/2021 #90, 0 refill  Returned because 2 labs are needed per protocol.     Requested Prescriptions  Pending Prescriptions Disp Refills   ferrous sulfate 325 (65 FE) MG tablet [Pharmacy Med Name: FERROUS SULFATE 325 MG TABLET] 90 tablet 0    Sig: TAKE 1 TABLET BY MOUTH EVERY DAY     Endocrinology:  Minerals - Iron Supplementation Failed - 12/17/2021  9:25 PM      Failed - Fe (serum) in normal range and within 360 days    No results found for: "IRON", "IRONPCTSAT"       Failed - Ferritin in normal range and within 360 days    Ferritin  Date Value Ref Range Status  04/24/2021 289 (H) 15 - 150 ng/mL Final         Passed - HGB in normal range and within 360 days    Hemoglobin  Date Value Ref Range Status  07/30/2021 14.1 11.1 - 15.9 g/dL Final         Passed - HCT in normal range and within 360 days    Hematocrit  Date Value Ref Range Status  07/30/2021 39.4 34.0 - 46.6 % Final         Passed - RBC in normal range and within 360 days    RBC  Date Value Ref Range Status  07/30/2021 4.21 3.77 - 5.28 x10E6/uL Final  11/05/2011 3.90 3.80 - 5.20 X10 6/mm 3 Final         Passed - Valid encounter within last 12 months    Recent Outpatient Visits           4 months ago Routine general medical examination at a health care facility   [facility], [place] P, DO   6 months ago Insomnia, unspecified type   [facility], Megan P, DO   7 months ago RLS (restless legs syndrome)   [facility] [place], Megan P, DO   1 year ago Abnormal EKG   [facility], [facility] P, DO   1 year ago Routine general medical examination at a health care facility   [facility] [place], Oralia Rud, DO       Future Appointments             In 1 month  Laural Benes, Oralia Rud, DO [facility], [facility]

## 2022-01-04 ENCOUNTER — Other Ambulatory Visit: Payer: Self-pay | Admitting: Family Medicine

## 2022-01-04 NOTE — Telephone Encounter (Signed)
Requested medication (s) are due for refill today: yes  Requested medication (s) are on the active medication list: yes  Last refill:  11/12/21 #12/0  Future visit scheduled: yes  Notes to clinic:  Unable to refill per protocol, cannot delegate.      Requested Prescriptions  Pending Prescriptions Disp Refills   Vitamin D, Ergocalciferol, (DRISDOL) 1.25 MG (50000 UNIT) CAPS capsule [Pharmacy Med Name: VITAMIN D2 1.25MG (50,000 UNIT)] 4 capsule 2    Sig: TAKE 1 CAPSULE (50,000 UNITS TOTAL) BY MOUTH EVERY 7 (SEVEN) DAYS     Endocrinology:  Vitamins - Vitamin D Supplementation 2 Failed - 01/04/2022  2:30 PM      Failed - Manual Review: Route requests for 50,000 IU strength to the provider      Failed - Vitamin D in normal range and within 360 days    Vit D, 25-Hydroxy  Date Value Ref Range Status  07/30/2021 21.6 (L) 30.0 - 100.0 ng/mL Final    Comment:    Vitamin D deficiency has been defined by the Institute of Medicine and an Endocrine Society practice guideline as a level of serum 25-OH vitamin D less than 20 ng/mL (1,2). The Endocrine Society went on to further define vitamin D insufficiency as a level between 21 and 29 ng/mL (2). 1. IOM (Institute of Medicine). 2010. Dietary reference    intakes for calcium and D. [place]: The    [employer]. 2. Holick MF, Binkley [place], Bischoff-Ferrari HA, et al.    Evaluation, treatment, and prevention of vitamin D    deficiency: an Endocrine Society clinical practice    guideline. JCEM. 2011 Jul; 96(7):1911-30.          Passed - Ca in normal range and within 360 days    Calcium  Date Value Ref Range Status  07/30/2021 9.3 8.7 - 10.2 mg/dL Final   Calcium, Total  Date Value Ref Range Status  11/05/2011 8.3 (L) 8.5 - 10.1 mg/dL Final         Passed - Valid encounter within last 12 months    Recent Outpatient Visits           5 months ago Routine general medical examination at a health care facility   [facility], [place] P, DO   7 months ago Insomnia, unspecified type   [facility], [facility] P, DO   8 months ago RLS (restless legs syndrome)   [facility] [place], Megan P, DO   1 year ago Abnormal EKG   [facility] [place], Megan P, DO   1 year ago Routine general medical examination at a health care facility   [facility] [place], Oralia Rud, DO       Future Appointments             In 3 weeks Laural Benes, Oralia Rud, DO [facility]

## 2022-01-18 ENCOUNTER — Other Ambulatory Visit: Payer: Self-pay | Admitting: Family Medicine

## 2022-01-22 NOTE — Telephone Encounter (Signed)
Refilled 12/19/2021 #90 0 refills - confirmed by same pharmacy. Requested Prescriptions  Pending Prescriptions Disp Refills  . ferrous sulfate 325 (65 FE) MG tablet [Pharmacy Med Name: FERROUS SULFATE 325 MG TABLET] 90 tablet 0    Sig: TAKE 1 TABLET BY MOUTH EVERY DAY     Endocrinology:  Minerals - Iron Supplementation Failed - 01/18/2022  7:02 PM      Failed - Fe (serum) in normal range and within 360 days    No results found for: "IRON", "IRONPCTSAT"       Failed - Ferritin in normal range and within 360 days    Ferritin  Date Value Ref Range Status  04/24/2021 289 (H) 15 - 150 ng/mL Final         Passed - HGB in normal range and within 360 days    Hemoglobin  Date Value Ref Range Status  07/30/2021 14.1 11.1 - 15.9 g/dL Final         Passed - HCT in normal range and within 360 days    Hematocrit  Date Value Ref Range Status  07/30/2021 39.4 34.0 - 46.6 % Final         Passed - RBC in normal range and within 360 days    RBC  Date Value Ref Range Status  07/30/2021 4.21 3.77 - 5.28 x10E6/uL Final  11/05/2011 3.90 3.80 - 5.20 X10 6/mm 3 Final         Passed - Valid encounter within last 12 months    Recent Outpatient Visits          5 months ago Routine general medical examination at a health care facility   [facility], [place] P, DO   7 months ago Insomnia, unspecified type   [facility], [facility] P, DO   9 months ago RLS (restless legs syndrome)   [facility] [place], [place] P, DO   1 year ago Abnormal EKG   [facility] [place], [facility] P, DO   2 years ago Routine general medical examination at a health care facility   [facility], [place] P, DO      Future Appointments            In 1 week Johnson, Megan P, DO [facility]           . cyclobenzaprine (FLEXERIL) 10 MG tablet [Pharmacy Med Name: CYCLOBENZAPRINE 10 MG TABLET] 30 tablet 2    Sig: TAKE 1 TABLET BY  MOUTH EVERYDAY AT BEDTIME     Not Delegated - Analgesics:  Muscle Relaxants Failed - 01/18/2022  7:02 PM      Failed - This refill cannot be delegated      Passed - Valid encounter within last 6 months    Recent Outpatient Visits          5 months ago Routine general medical examination at a health care facility   [facility], [place] P, DO   7 months ago Insomnia, unspecified type   [facility], [facility] P, DO   9 months ago RLS (restless legs syndrome)   [facility] [place], Megan P, DO   1 year ago Abnormal EKG   [facility] [place], Megan P, DO   2 years ago Routine general medical examination at a health care facility   [facility] [place], Oralia Rud, DO      Future Appointments  In 1 week Wynetta Emery, Barb Merino, DO [facility]

## 2022-01-22 NOTE — Telephone Encounter (Signed)
Requested medications are due for refill today.  yes  Requested medications are on the active medications list.  yes  Last refill. 10/02/2021 #30 2 refills  Future visit scheduled.   yes  Notes to clinic.  Medication refills not delegated.    Requested Prescriptions  Pending Prescriptions Disp Refills   cyclobenzaprine (FLEXERIL) 10 MG tablet [Pharmacy Med Name: CYCLOBENZAPRINE 10 MG TABLET] 30 tablet 2    Sig: TAKE 1 TABLET BY MOUTH EVERYDAY AT BEDTIME     Not Delegated - Analgesics:  Muscle Relaxants Failed - 01/18/2022  7:02 PM      Failed - This refill cannot be delegated      Passed - Valid encounter within last 6 months    Recent Outpatient Visits           5 months ago Routine general medical examination at a health care facility   [facility], [place] P, DO   7 months ago Insomnia, unspecified type   [facility], Megan P, DO   9 months ago RLS (restless legs syndrome)   [facility], Megan P, DO   1 year ago Abnormal EKG   [facility] [place], Megan P, DO   2 years ago Routine general medical examination at a health care facility   [facility] [place], [place] P, DO       Future Appointments             In 1 week Laural Benes, Megan P, DO [facility]            Refused Prescriptions Disp Refills   ferrous sulfate 325 (65 FE) MG tablet [Pharmacy Med Name: FERROUS SULFATE 325 MG TABLET] 90 tablet 0    Sig: TAKE 1 TABLET BY MOUTH EVERY DAY     Endocrinology:  Minerals - Iron Supplementation Failed - 01/18/2022  7:02 PM      Failed - Fe (serum) in normal range and within 360 days    No results found for: "IRON", "IRONPCTSAT"       Failed - Ferritin in normal range and within 360 days    Ferritin  Date Value Ref Range Status  04/24/2021 289 (H) 15 - 150 ng/mL Final         Passed - HGB in normal range and within 360 days    Hemoglobin  Date Value Ref Range  Status  07/30/2021 14.1 11.1 - 15.9 g/dL Final         Passed - HCT in normal range and within 360 days    Hematocrit  Date Value Ref Range Status  07/30/2021 39.4 34.0 - 46.6 % Final         Passed - RBC in normal range and within 360 days    RBC  Date Value Ref Range Status  07/30/2021 4.21 3.77 - 5.28 x10E6/uL Final  11/05/2011 3.90 3.80 - 5.20 X10 6/mm 3 Final         Passed - Valid encounter within last 12 months    Recent Outpatient Visits           5 months ago Routine general medical examination at a health care facility   [facility], [place] P, DO   7 months ago Insomnia, unspecified type   [facility], Megan P, DO   9 months ago RLS (restless legs syndrome)   [facility] [place], Megan P, DO   1 year ago  Abnormal EKG   [facility] [place], [place] P, DO   2 years ago Routine general medical examination at a health care facility   [facility] [place], [place], DO       Future Appointments             In 1 week Laural Benes, Oralia Rud, DO [facility]

## 2022-01-31 ENCOUNTER — Ambulatory Visit: Payer: Commercial Managed Care - PPO | Admitting: Family Medicine

## 2022-01-31 ENCOUNTER — Encounter: Payer: Self-pay | Admitting: Family Medicine

## 2022-01-31 VITALS — BP 138/88 | HR 68 | Temp 98.1°F | Wt 145.2 lb

## 2022-01-31 DIAGNOSIS — M8580 Other specified disorders of bone density and structure, unspecified site: Secondary | ICD-10-CM

## 2022-01-31 DIAGNOSIS — Z1231 Encounter for screening mammogram for malignant neoplasm of breast: Secondary | ICD-10-CM

## 2022-01-31 DIAGNOSIS — E559 Vitamin D deficiency, unspecified: Secondary | ICD-10-CM | POA: Diagnosis not present

## 2022-01-31 DIAGNOSIS — E782 Mixed hyperlipidemia: Secondary | ICD-10-CM | POA: Diagnosis not present

## 2022-01-31 DIAGNOSIS — G4700 Insomnia, unspecified: Secondary | ICD-10-CM

## 2022-01-31 DIAGNOSIS — E039 Hypothyroidism, unspecified: Secondary | ICD-10-CM

## 2022-01-31 DIAGNOSIS — Z23 Encounter for immunization: Secondary | ICD-10-CM

## 2022-01-31 MED ORDER — ESZOPICLONE 1 MG PO TABS: 1.0000 mg | ORAL_TABLET | Freq: Every evening | ORAL | 5 refills | Status: DC | PRN

## 2022-01-31 NOTE — Patient Instructions (Signed)
Please call to schedule your mammogram and/or bone density: [facility]  Address: [address], [place], [place] [address] Phone: [phone]  [facility] [place] [address] [place],  [place]  [address] Phone: [phone]   

## 2022-01-31 NOTE — Assessment & Plan Note (Signed)
Due for recheck on DEXA.

## 2022-01-31 NOTE — Assessment & Plan Note (Signed)
Rechecking labs today. Await results. Treat as needed.  °

## 2022-01-31 NOTE — Assessment & Plan Note (Signed)
Under good control on current regimen. Continue current regimen. Continue to monitor. Call with any concerns. Refills given for 6 months. Follow up 6 months. 

## 2022-01-31 NOTE — Progress Notes (Signed)
BP 138/88   Pulse 68   Temp 98.1 F (36.7 C)   Wt 145 lb 3.2 oz (65.9 kg)   LMP  (LMP Unknown)   SpO2 97%   BMI 26.34 kg/m    Subjective:    Patient ID: Vicki Schultz, female    DOB: [birth year], 51 y.o.   MRN: [id number]  HPI: Vicki Schultz is a 51 y.o. female  Chief Complaint  Patient presents with   Hypothyroidism   Hyperlipidemia   HYPERLIPIDEMIA Hyperlipidemia status: excellent compliance Satisfied with current treatment?  yes Side effects:  not on anything Medication compliance: excellent compliance Past cholesterol meds: none Supplements: none Aspirin:  no The 10-year ASCVD risk score (Arnett DK, et al., 2019) is: 3.3%   Values used to calculate the score:     Age: 84 years     Sex: Female     Is Non-Hispanic African American: No     Diabetic: No     Tobacco smoker: No     Systolic Blood Pressure: 415 mmHg     Is BP treated: No     HDL Cholesterol: 41 mg/dL     Total Cholesterol: 247 mg/dL Chest pain:  no Coronary artery disease:  no  HYPOTHYROIDISM Thyroid control status:controlled Satisfied with current treatment? yes Medication side effects: no Medication compliance: excellent compliance Recent dose adjustment:no Fatigue: no Cold intolerance: no Heat intolerance: no Weight gain: no Weight loss: no Constipation: no Diarrhea/loose stools: no Palpitations: no Lower extremity edema: no Anxiety/depressed mood: no  INSOMNIA Duration: chronic Satisfied with sleep quality: yes Difficulty falling asleep: no Difficulty staying asleep: no Waking a few hours after sleep onset: no Early morning awakenings: no Daytime hypersomnolence: no Wakes feeling refreshed: yes Good sleep hygiene: yes Apnea: no Snoring: no Depressed/anxious mood: no Recent stress: no Restless legs/nocturnal leg cramps: no Chronic pain/arthritis: no History of sleep study: no Treatments attempted: melatonin, uinsom, benadryl, and ambien   Relevant past medical,  surgical, family and social history reviewed and updated as indicated. Interim medical history since our last visit reviewed. Allergies and medications reviewed and updated.  Review of Systems  Constitutional: Negative.   Respiratory: Negative.    Cardiovascular: Negative.   Gastrointestinal: Negative.   Musculoskeletal: Negative.   Neurological: Negative.   Psychiatric/Behavioral: Negative.      Per HPI unless specifically indicated above     Objective:    BP 138/88   Pulse 68   Temp 98.1 F (36.7 C)   Wt 145 lb 3.2 oz (65.9 kg)   LMP  (LMP Unknown)   SpO2 97%   BMI 26.34 kg/m   Wt Readings from Last 3 Encounters:  01/31/22 145 lb 3.2 oz (65.9 kg)  07/30/21 147 lb 12.8 oz (67 kg)  06/08/21 144 lb 12.8 oz (65.7 kg)    Physical Exam Vitals and nursing note reviewed.  Constitutional:      General: She is not in acute distress.    Appearance: Normal appearance. She is normal weight. She is not ill-appearing, toxic-appearing or diaphoretic.  HENT:     Head: Normocephalic and atraumatic.     Right Ear: External ear normal.     Left Ear: External ear normal.     Nose: Nose normal.     Mouth/Throat:     Mouth: Mucous membranes are moist.     Pharynx: Oropharynx is clear.  Eyes:     General: No scleral icterus.       Right eye: No  discharge.        Left eye: No discharge.     Extraocular Movements: Extraocular movements intact.     Conjunctiva/sclera: Conjunctivae normal.     Pupils: Pupils are equal, round, and reactive to light.  Cardiovascular:     Rate and Rhythm: Normal rate and regular rhythm.     Pulses: Normal pulses.     Heart sounds: Normal heart sounds. No murmur heard.    No friction rub. No gallop.  Pulmonary:     Effort: Pulmonary effort is normal. No respiratory distress.     Breath sounds: Normal breath sounds. No stridor. No wheezing, rhonchi or rales.  Chest:     Chest wall: No tenderness.  Musculoskeletal:        General: Normal range of  motion.     Cervical back: Normal range of motion and neck supple.  Skin:    General: Skin is warm and dry.     Capillary Refill: Capillary refill takes less than 2 seconds.     Coloration: Skin is not jaundiced or pale.     Findings: No bruising, erythema, lesion or rash.  Neurological:     General: No focal deficit present.     Mental Status: She is alert and oriented to person, place, and time. Mental status is at baseline.  Psychiatric:        Mood and Affect: Mood normal.        Behavior: Behavior normal.        Thought Content: Thought content normal.        Judgment: Judgment normal.     Results for orders placed or performed in visit on 07/30/21  CBC with Differential/Platelet  Result Value Ref Range   WBC 7.6 3.4 - 10.8 x10E3/uL   RBC 4.21 3.77 - 5.28 x10E6/uL   Hemoglobin 14.1 11.1 - 15.9 g/dL   Hematocrit 39.4 34.0 - 46.6 %   MCV 94 79 - 97 fL   MCH 33.5 (H) 26.6 - 33.0 pg   MCHC 35.8 (H) 31.5 - 35.7 g/dL   RDW 12.9 11.7 - 15.4 %   Platelets 262 150 - 450 x10E3/uL   Neutrophils 47 Not Estab. %   Lymphs 41 Not Estab. %   Monocytes 6 Not Estab. %   Eos 5 Not Estab. %   Basos 1 Not Estab. %   Neutrophils Absolute 3.7 1.4 - 7.0 x10E3/uL   Lymphocytes Absolute 3.1 0.7 - 3.1 x10E3/uL   Monocytes Absolute 0.4 0.1 - 0.9 x10E3/uL   EOS (ABSOLUTE) 0.4 0.0 - 0.4 x10E3/uL   Basophils Absolute 0.1 0.0 - 0.2 x10E3/uL   Immature Granulocytes 0 Not Estab. %   Immature Grans (Abs) 0.0 0.0 - 0.1 x10E3/uL  Comprehensive metabolic panel  Result Value Ref Range   Glucose 108 (H) 70 - 99 mg/dL   BUN 12 6 - 24 mg/dL   Creatinine, Ser 0.95 0.57 - 1.00 mg/dL   eGFR 72 >59 mL/min/1.73   BUN/Creatinine Ratio 13 9 - 23   Sodium 141 134 - 144 mmol/L   Potassium 3.5 3.5 - 5.2 mmol/L   Chloride 102 96 - 106 mmol/L   CO2 26 20 - 29 mmol/L   Calcium 9.3 8.7 - 10.2 mg/dL   Total Protein 7.1 6.0 - 8.5 g/dL   Albumin 4.3 3.8 - 4.9 g/dL   Globulin, Total 2.8 1.5 - 4.5 g/dL    Albumin/Globulin Ratio 1.5 1.2 - 2.2   Bilirubin Total 0.3 0.0 -  1.2 mg/dL   Alkaline Phosphatase 96 44 - 121 IU/L   AST 23 0 - 40 IU/L   ALT 22 0 - 32 IU/L  Lipid Panel w/o Chol/HDL Ratio  Result Value Ref Range   Cholesterol, Total 247 (H) 100 - 199 mg/dL   Triglycerides 318 (H) 0 - 149 mg/dL   HDL 41 >39 mg/dL   VLDL Cholesterol Cal 59 (H) 5 - 40 mg/dL   LDL Chol Calc (NIH) 147 (H) 0 - 99 mg/dL  Urinalysis, Routine w reflex microscopic  Result Value Ref Range   Specific Gravity, UA 1.015 1.005 - 1.030   pH, UA 5.5 5.0 - 7.5   Color, UA Yellow Yellow   Appearance Ur Clear Clear   Leukocytes,UA Trace (A) Negative   Protein,UA Negative Negative/Trace   Glucose, UA Negative Negative   Ketones, UA Negative Negative   RBC, UA Negative Negative   Bilirubin, UA Negative Negative   Urobilinogen, Ur 0.2 0.2 - 1.0 mg/dL   Nitrite, UA Negative Negative  TSH  Result Value Ref Range   TSH 1.060 0.450 - 4.500 uIU/mL  VITAMIN D 25 Hydroxy (Vit-D Deficiency, Fractures)  Result Value Ref Range   Vit D, 25-Hydroxy 21.6 (L) 30.0 - 100.0 ng/mL      Assessment & Plan:   Problem List Items Addressed This Visit       Endocrine   Hypothyroidism - Primary    Rechecking labs today. Await results. Treat as needed.       Relevant Orders   CBC with Differential/Platelet   TSH     Musculoskeletal and Integument   Osteopenia    Due for recheck on DEXA.      Relevant Orders   DG Bone Density     Other   Mixed hyperlipidemia    Rechecking labs today. Await results. Treat as needed.       Relevant Orders   CBC with Differential/Platelet   Comprehensive metabolic panel   Lipid Panel w/o Chol/HDL Ratio   Insomnia    Under good control on current regimen. Continue current regimen. Continue to monitor. Call with any concerns. Refills given for 6 months. Follow up 6 months.       Vitamin D deficiency    Rechecking labs today. Await results. Treat as needed.       Relevant Orders    CBC with Differential/Platelet   VITAMIN D 25 Hydroxy (Vit-D Deficiency, Fractures)   Other Visit Diagnoses     Encounter for screening mammogram for malignant neoplasm of breast       Mammo ordered today.   Relevant Orders   MM 3D SCREEN BREAST BILATERAL   Need for influenza vaccination       Relevant Orders   Flu Vaccine QUAD 6+ mos PF IM (Fluarix Quad PF) (Completed)        Follow up plan: Return in about 6 months (around 08/01/2022) for physical.

## 2022-02-05 LAB — CBC WITH DIFFERENTIAL/PLATELET
Basophils Absolute: 0.1 10*3/uL (ref 0.0–0.2)
Basos: 1 %
EOS (ABSOLUTE): 0.5 10*3/uL — ABNORMAL HIGH (ref 0.0–0.4)
Eos: 7 %
Hematocrit: 40.6 % (ref 34.0–46.6)
Hemoglobin: 13.5 g/dL (ref 11.1–15.9)
Immature Grans (Abs): 0 10*3/uL (ref 0.0–0.1)
Immature Granulocytes: 0 %
Lymphocytes Absolute: 3.4 10*3/uL — ABNORMAL HIGH (ref 0.7–3.1)
Lymphs: 48 %
MCH: 31.9 pg (ref 26.6–33.0)
MCHC: 33.3 g/dL (ref 31.5–35.7)
MCV: 96 fL (ref 79–97)
Monocytes Absolute: 0.5 10*3/uL (ref 0.1–0.9)
Monocytes: 7 %
Neutrophils Absolute: 2.6 10*3/uL (ref 1.4–7.0)
Neutrophils: 37 %
Platelets: 248 10*3/uL (ref 150–450)
RBC: 4.23 x10E6/uL (ref 3.77–5.28)
RDW: 13.2 % (ref 11.7–15.4)
WBC: 7 10*3/uL (ref 3.4–10.8)

## 2022-02-05 LAB — COMPREHENSIVE METABOLIC PANEL
ALT: 27 IU/L (ref 0–32)
AST: 23 IU/L (ref 0–40)
Albumin/Globulin Ratio: 1.6 (ref 1.2–2.2)
Albumin: 4.1 g/dL (ref 3.8–4.9)
Alkaline Phosphatase: 90 IU/L (ref 44–121)
BUN/Creatinine Ratio: 14 (ref 9–23)
BUN: 13 mg/dL (ref 6–24)
Bilirubin Total: 0.2 mg/dL (ref 0.0–1.2)
CO2: 22 mmol/L (ref 20–29)
Calcium: 9.1 mg/dL (ref 8.7–10.2)
Chloride: 104 mmol/L (ref 96–106)
Creatinine, Ser: 0.93 mg/dL (ref 0.57–1.00)
Globulin, Total: 2.5 g/dL (ref 1.5–4.5)
Glucose: 104 mg/dL — ABNORMAL HIGH (ref 70–99)
Potassium: 3.7 mmol/L (ref 3.5–5.2)
Sodium: 142 mmol/L (ref 134–144)
Total Protein: 6.6 g/dL (ref 6.0–8.5)
eGFR: 73 mL/min/{1.73_m2} (ref >59)

## 2022-02-05 LAB — LIPID PANEL W/O CHOL/HDL RATIO
Cholesterol, Total: 253 mg/dL — ABNORMAL HIGH (ref 100–199)
HDL: 31 mg/dL — ABNORMAL LOW (ref >39)
LDL Chol Calc (NIH): 135 mg/dL — ABNORMAL HIGH (ref 0–99)
Triglycerides: 476 mg/dL — ABNORMAL HIGH (ref 0–149)
VLDL Cholesterol Cal: 87 mg/dL — ABNORMAL HIGH (ref 5–40)

## 2022-02-05 LAB — TSH: TSH: 0.702 u[IU]/mL (ref 0.450–4.500)

## 2022-02-05 LAB — VITAMIN D 25 HYDROXY (VIT D DEFICIENCY, FRACTURES): Vit D, 25-Hydroxy: 38.9 ng/mL (ref 30.0–100.0)

## 2022-02-11 ENCOUNTER — Other Ambulatory Visit: Payer: Self-pay | Admitting: Family Medicine

## 2022-02-12 NOTE — Telephone Encounter (Signed)
Vitamin D is good. Does not need big dose- OK to just take 1000-2000IU D3 OTC daily to keep it were it needs to be

## 2022-02-12 NOTE — Telephone Encounter (Signed)
Requested medication (s) are due for refill today: Yes  Requested medication (s) are on the active medication list: Yes  Last refill:  11/12/21  Future visit scheduled: No  Notes to clinic:  See request.    Requested Prescriptions  Pending Prescriptions Disp Refills   Vitamin D, Ergocalciferol, (DRISDOL) 1.25 MG (50000 UNIT) CAPS capsule [Pharmacy Med Name: VITAMIN D2 1.25MG (50,000 UNIT)] 4 capsule 2    Sig: TAKE 1 CAPSULE (50,000 UNITS TOTAL) BY MOUTH EVERY 7 (SEVEN) DAYS     Endocrinology:  Vitamins - Vitamin D Supplementation 2 Failed - 02/11/2022  2:34 PM      Failed - Manual Review: Route requests for 50,000 IU strength to the provider      Passed - Ca in normal range and within 360 days    Calcium  Date Value Ref Range Status  01/31/2022 9.1 8.7 - 10.2 mg/dL Final   Calcium, Total  Date Value Ref Range Status  11/05/2011 8.3 (L) 8.5 - 10.1 mg/dL Final         Passed - Vitamin D in normal range and within 360 days    Vit D, 25-Hydroxy  Date Value Ref Range Status  01/31/2022 38.9 30.0 - 100.0 ng/mL Final    Comment:    Vitamin D deficiency has been defined by the Institute of Medicine and an Endocrine Society practice guideline as a level of serum 25-OH vitamin D less than 20 ng/mL (1,2). The Endocrine Society went on to further define vitamin D insufficiency as a level between 21 and 29 ng/mL (2). 1. IOM (Institute of Medicine). 2010. Dietary reference    intakes for calcium and D. [place]: The    [employer]. 2. Holick MF, Binkley [place], Bischoff-Ferrari HA, et al.    Evaluation, treatment, and prevention of vitamin D    deficiency: an Endocrine Society clinical practice    guideline. JCEM. 2011 Jul; 96(7):1911-30.          Passed - Valid encounter within last 12 months    Recent Outpatient Visits           1 week ago Hypothyroidism, unspecified type   [place], Megan P, DO   6 months ago Routine general medical  examination at a health care facility   [facility], [place] P, DO   8 months ago Insomnia, unspecified type   [facility], Megan P, DO   9 months ago RLS (restless legs syndrome)   [place], DO   1 year ago Abnormal EKG   [place], [place], DO

## 2022-02-17 ENCOUNTER — Other Ambulatory Visit: Payer: Self-pay | Admitting: Family Medicine

## 2022-02-18 NOTE — Telephone Encounter (Signed)
Requested medications are due for refill today.  Lunesta - no, Mupirocin yes  Requested medications are on the active medications list.  yes  Last refill. Lunesta 01/31/2022 # 30 5 rf,  Mupirocin 10g 0 rf 06/15/2021  Future visit scheduled.   no  Notes to clinic.  Refills not delegated.    Requested Prescriptions  Pending Prescriptions Disp Refills   mupirocin ointment (BACTROBAN) 2 % [Pharmacy Med Name: MUPIROCIN 2% OINTMENT] 22 g     Sig: APPLY TOPICALLY TWICE A DAY     Off-Protocol Failed - 02/17/2022  8:51 PM      Failed - Medication not assigned to a protocol, review manually.      Passed - Valid encounter within last 12 months    Recent Outpatient Visits           2 weeks ago Hypothyroidism, unspecified type   [place], Megan P, DO   6 months ago Routine general medical examination at a health care facility   [facility], [place] P, DO   8 months ago Insomnia, unspecified type   [facility], Megan P, DO   10 months ago RLS (restless legs syndrome)   [place], Megan P, DO   1 year ago Abnormal EKG   [facility], Megan P, DO               eszopiclone (LUNESTA) 1 MG TABS tablet [Pharmacy Med Name: ESZOPICLONE 1 MG TABLET] 30 tablet     Sig: TAKE 1 TABLET (1 MG TOTAL) BY MOUTH AT BEDTIME AS NEEDED FOR SLEEP. TAKE IMMEDIATELY BEFORE BEDTIME     Not Delegated - Psychiatry:  Anxiolytics/Hypnotics Failed - 02/17/2022  8:51 PM      Failed - This refill cannot be delegated      Failed - Urine Drug Screen completed in last 360 days      Passed - Valid encounter within last 6 months    Recent Outpatient Visits           2 weeks ago Hypothyroidism, unspecified type   [place], Megan P, DO   6 months ago Routine general medical examination at a health care facility   [facility], [place] P, DO   8 months ago Insomnia,  unspecified type   [facility], Megan P, DO   10 months ago RLS (restless legs syndrome)   [place], Megan P, DO   1 year ago Abnormal EKG   [facility], Megan P, DO              Refused Prescriptions Disp Refills   naproxen (NAPROSYN) 500 MG tablet [Pharmacy Med Name: NAPROXEN 500 MG TABLET] 180 tablet 1    Sig: TAKE 1 TABLET BY MOUTH TWICE DAILY WITH A MEAL     Analgesics:  NSAIDS Failed - 02/17/2022  8:51 PM      Failed - Manual Review: Labs are only required if the patient has taken medication for more than 8 weeks.      Passed - Cr in normal range and within 360 days    Creatinine  Date Value Ref Range Status  11/05/2011 0.84 0.60 - 1.30 mg/dL Final   Creatinine, Ser  Date Value Ref Range Status  01/31/2022 0.93 0.57 - 1.00 mg/dL Final         Passed - HGB in normal range and within 360  days    Hemoglobin  Date Value Ref Range Status  01/31/2022 13.5 11.1 - 15.9 g/dL Final         Passed - PLT in normal range and within 360 days    Platelets  Date Value Ref Range Status  01/31/2022 248 150 - 450 x10E3/uL Final         Passed - HCT in normal range and within 360 days    Hematocrit  Date Value Ref Range Status  01/31/2022 40.6 34.0 - 46.6 % Final         Passed - eGFR is 30 or above and within 360 days    EGFR (African American)  Date Value Ref Range Status  11/05/2011 >60  Final   GFR calc Af Amer  Date Value Ref Range Status  01/13/2020 84 >59 mL/min/1.73 Final    Comment:    **[facility] currently reports eGFR in compliance with the current**   recommendations of the [employer]. [facility] will   update reporting as new guidelines are published from the NKF-ASN   Task force.    EGFR (Non-African Amer.)  Date Value Ref Range Status  11/05/2011 >60  Final    Comment:    eGFR values <7m/min/1.73 m2 may be an indication of chronic kidney disease (CKD). Calculated eGFR  is useful in patients with stable renal function. The eGFR calculation will not be reliable in acutely ill patients when serum creatinine is changing rapidly. It is not useful in  patients on dialysis. The eGFR calculation may not be applicable to patients at the low and high extremes of body sizes, pregnant women, and vegetarians.    GFR calc non Af Amer  Date Value Ref Range Status  01/13/2020 73 >59 mL/min/1.73 Final   eGFR  Date Value Ref Range Status  01/31/2022 73 >59 mL/min/1.73 Final         Passed - Patient is not pregnant      Passed - Valid encounter within last 12 months    Recent Outpatient Visits           2 weeks ago Hypothyroidism, unspecified type   [place]   6 months ago Routine general medical examination at a health care facility   [facility] [place]   8 months ago Insomnia, unspecified type   [facility]   10 months ago RLS (restless legs syndrome)   [place], [place]   1 year ago Abnormal EKG   [facility]               ferrous sulfate 325 (65 FE) MG tablet [Pharmacy Med Name: FERROUS SULFATE 325 MG TABLET] 90 tablet 0    Sig: TAKE 1 TABLET BY [place]    Endocrinology:  Minerals - Iron Supplementation Failed - 02/17/2022  8:51 PM      Failed - Fe (serum) in normal range and within 360 days    No results found for: "IRON", "IRONPCTSAT"       Failed - Ferritin in normal range and within 360 days    Ferritin  Date Value Ref Range Status  04/24/2021 289 (H) 15 - 150 ng/mL Final         Passed - HGB in normal range and within 360 days    Hemoglobin  Date Value Ref Range Status  01/31/2022 13.5  11.1 - 15.9 g/dL Final         Passed - HCT in normal range and within 360 days    Hematocrit  Date Value Ref Range Status  01/31/2022 40.6 34.0 - 46.6 % Final         Passed -  RBC in normal range and within 360 days    RBC  Date Value Ref Range Status  01/31/2022 4.23 3.77 - 5.28 x10E6/uL Final  11/05/2011 3.90 3.80 - 5.20 X10 6/mm 3 Final         Passed - Valid encounter within last 12 months    Recent Outpatient Visits           2 weeks ago Hypothyroidism, unspecified type   [place], Megan P, DO   6 months ago Routine general medical examination at a health care facility   [facility], [place] P, DO   8 months ago Insomnia, unspecified type   [facility], Megan P, DO   10 months ago RLS (restless legs syndrome)   [place], DO   1 year ago Abnormal EKG   [place], Megan P, DO               cf

## 2022-02-18 NOTE — Telephone Encounter (Signed)
Naprosyn refilled 09/25/2021 #180 1 rf. Ferrous Sulfate 12/19/2021 #90 0 rf. Requested Prescriptions  Pending Prescriptions Disp Refills  . naproxen (NAPROSYN) 500 MG tablet [Pharmacy Med Name: NAPROXEN 500 MG TABLET] 180 tablet 1    Sig: TAKE 1 TABLET BY MOUTH TWICE DAILY WITH A MEAL     Analgesics:  NSAIDS Failed - 02/17/2022  8:51 PM      Failed - Manual Review: Labs are only required if the patient has taken medication for more than 8 weeks.      Passed - Cr in normal range and within 360 days    Creatinine  Date Value Ref Range Status  11/05/2011 0.84 0.60 - 1.30 mg/dL Final   Creatinine, Ser  Date Value Ref Range Status  01/31/2022 0.93 0.57 - 1.00 mg/dL Final         Passed - HGB in normal range and within 360 days    Hemoglobin  Date Value Ref Range Status  01/31/2022 13.5 11.1 - 15.9 g/dL Final         Passed - PLT in normal range and within 360 days    Platelets  Date Value Ref Range Status  01/31/2022 248 150 - 450 x10E3/uL Final         Passed - HCT in normal range and within 360 days    Hematocrit  Date Value Ref Range Status  01/31/2022 40.6 34.0 - 46.6 % Final         Passed - eGFR is 30 or above and within 360 days    EGFR (African American)  Date Value Ref Range Status  11/05/2011 >60  Final   GFR calc Af Amer  Date Value Ref Range Status  01/13/2020 84 >59 mL/min/1.73 Final    Comment:    **[facility] currently reports eGFR in compliance with the current**   recommendations of the [employer]. [facility] will   update reporting as new guidelines are published from the NKF-ASN   Task force.    EGFR (Non-African Amer.)  Date Value Ref Range Status  11/05/2011 >60  Final    Comment:    eGFR values <67m/min/1.73 m2 may be an indication of chronic kidney disease (CKD). Calculated eGFR is useful in patients with stable renal function. The eGFR calculation will not be reliable in acutely ill patients when serum creatinine is changing  rapidly. It is not useful in  patients on dialysis. The eGFR calculation may not be applicable to patients at the low and high extremes of body sizes, pregnant women, and vegetarians.    GFR calc non Af Amer  Date Value Ref Range Status  01/13/2020 73 >59 mL/min/1.73 Final   eGFR  Date Value Ref Range Status  01/31/2022 73 >59 mL/min/1.73 Final         Passed - Patient is not pregnant      Passed - Valid encounter within last 12 months    Recent Outpatient Visits          2 weeks ago Hypothyroidism, unspecified type   [place], DO   6 months ago Routine general medical examination at a health care facility   [facility] [place]   8 months ago Insomnia, unspecified type   [facility]   10 months ago RLS (restless legs syndrome)   [place] DO   1 year ago Abnormal EKG   [facility]  [place] Megan P, DO             . ferrous sulfate 325 (65 FE) MG tablet [Pharmacy Med Name: FERROUS SULFATE 325 MG TABLET] 90 tablet 0    Sig: TAKE 1 TABLET BY MOUTH EVERY DAY     Endocrinology:  Minerals - Iron Supplementation Failed - 02/17/2022  8:51 PM      Failed - Fe (serum) in normal range and within 360 days    No results found for: "IRON", "IRONPCTSAT"       Failed - Ferritin in normal range and within 360 days    Ferritin  Date Value Ref Range Status  04/24/2021 289 (H) 15 - 150 ng/mL Final         Passed - HGB in normal range and within 360 days    Hemoglobin  Date Value Ref Range Status  01/31/2022 13.5 11.1 - 15.9 g/dL Final         Passed - HCT in normal range and within 360 days    Hematocrit  Date Value Ref Range Status  01/31/2022 40.6 34.0 - 46.6 % Final         Passed - RBC in normal range and within 360 days    RBC  Date Value Ref Range Status  01/31/2022 4.23 3.77 - 5.28 x10E6/uL Final  11/05/2011 3.90 3.80 - 5.20 X10  6/mm 3 Final         Passed - Valid encounter within last 12 months    Recent Outpatient Visits          2 weeks ago Hypothyroidism, unspecified type   [place], Megan P, DO   6 months ago Routine general medical examination at a health care facility   [facility], [place] P, DO   8 months ago Insomnia, unspecified type   [facility]   10 months ago RLS (restless legs syndrome)   [place], Megan P, DO   1 year ago Abnormal EKG   [facility] [place], Megan P, DO             . mupirocin ointment (BACTROBAN) 2 % [Pharmacy Med Name: MUPIROCIN 2% OINTMENT] 22 g     Sig: APPLY TOPICALLY TWICE A DAY     Off-Protocol Failed - 02/17/2022  8:51 PM      Failed - Medication not assigned to a protocol, review manually.      Passed - Valid encounter within last 12 months    Recent Outpatient Visits          2 weeks ago Hypothyroidism, unspecified type   [place], Megan P, DO   6 months ago Routine general medical examination at a health care facility   [facility], [place] P, DO   8 months ago Insomnia, unspecified type   [facility], Megan P, DO   10 months ago RLS (restless legs syndrome)   [place], Megan P, DO   1 year ago Abnormal EKG   [facility], Megan P, DO             . eszopiclone (LUNESTA) 1 MG TABS tablet [Pharmacy Med Name: ESZOPICLONE 1 MG TABLET] 30 tablet     Sig: TAKE 1 TABLET (1 MG TOTAL) BY MOUTH AT BEDTIME AS NEEDED FOR SLEEP. TAKE IMMEDIATELY BEFORE BEDTIME     Not Delegated - Psychiatry:  Anxiolytics/Hypnotics Failed - 02/17/2022  8:51 PM      Failed - This refill cannot be delegated      Failed - Urine Drug Screen completed in last 360 days      Passed - Valid encounter within last 6 months    Recent Outpatient Visits          2  weeks ago Hypothyroidism, unspecified type   [place], [place], DO   6 months ago Routine general medical examination at a health care facility   [facility], [place] P, DO   8 months ago Insomnia, unspecified type   [facility], Megan P, DO   10 months ago RLS (restless legs syndrome)   [place], DO   1 year ago Abnormal EKG   [place], [place], DO

## 2022-02-26 ENCOUNTER — Other Ambulatory Visit: Payer: Self-pay | Admitting: Family Medicine

## 2022-02-27 NOTE — Telephone Encounter (Signed)
Requested medication (s) are due for refill today:   Provider to review  Requested medication (s) are on the active medication list:   Yes  Future visit scheduled:   No   Seen 3 wks ago   Last ordered: 7/32023 #12, 0 refills  Non delegated refill  reason returned.   Requested Prescriptions  Pending Prescriptions Disp Refills   Vitamin D, Ergocalciferol, (DRISDOL) 1.25 MG (50000 UNIT) CAPS capsule [Pharmacy Med Name: VITAMIN D2 1.25MG (50,000 UNIT)] 4 capsule 2    Sig: TAKE 1 CAPSULE (50,000 UNITS TOTAL) BY MOUTH EVERY 7 (SEVEN) DAYS     Endocrinology:  Vitamins - Vitamin D Supplementation 2 Failed - 02/26/2022  6:07 PM      Failed - Manual Review: Route requests for 50,000 IU strength to the provider      Passed - Ca in normal range and within 360 days    Calcium  Date Value Ref Range Status  01/31/2022 9.1 8.7 - 10.2 mg/dL Final   Calcium, Total  Date Value Ref Range Status  11/05/2011 8.3 (L) 8.5 - 10.1 mg/dL Final         Passed - Vitamin D in normal range and within 360 days    Vit D, 25-Hydroxy  Date Value Ref Range Status  01/31/2022 38.9 30.0 - 100.0 ng/mL Final    Comment:    Vitamin D deficiency has been defined by the Institute of Medicine and an Endocrine Society practice guideline as a level of serum 25-OH vitamin D less than 20 ng/mL (1,2). The Endocrine Society went on to further define vitamin D insufficiency as a level between 21 and 29 ng/mL (2). 1. IOM (Institute of Medicine). 2010. Dietary reference    intakes for calcium and D. [place]: The    [employer]. 2. Holick MF, Binkley [place], Bischoff-Ferrari HA, et al.    Evaluation, treatment, and prevention of vitamin D    deficiency: an Endocrine Society clinical practice    guideline. JCEM. 2011 Jul; 96(7):1911-30.          Passed - Valid encounter within last 12 months    Recent Outpatient Visits           3 weeks ago Hypothyroidism, unspecified type   [place], Megan P, DO   7 months ago Routine general medical examination at a health care facility   [facility], [place] P, DO   8 months ago Insomnia, unspecified type   [facility], Megan P, DO   10 months ago RLS (restless legs syndrome)   [place], DO   1 year ago Abnormal EKG   [place], [place], DO

## 2022-03-19 ENCOUNTER — Other Ambulatory Visit: Payer: Self-pay | Admitting: Family Medicine

## 2022-03-20 NOTE — Telephone Encounter (Signed)
Requested Prescriptions  Pending Prescriptions Disp Refills   naproxen (NAPROSYN) 500 MG tablet [Pharmacy Med Name: NAPROXEN 500 MG TABLET] 180 tablet 0    Sig: TAKE 1 TABLET BY MOUTH TWICE DAILY WITH A MEAL     Analgesics:  NSAIDS Failed - 03/19/2022  8:08 PM      Failed - Manual Review: Labs are only required if the patient has taken medication for more than 8 weeks.      Passed - Cr in normal range and within 360 days    Creatinine  Date Value Ref Range Status  11/05/2011 0.84 0.60 - 1.30 mg/dL Final   Creatinine, Ser  Date Value Ref Range Status  01/31/2022 0.93 0.57 - 1.00 mg/dL Final         Passed - HGB in normal range and within 360 days    Hemoglobin  Date Value Ref Range Status  01/31/2022 13.5 11.1 - 15.9 g/dL Final         Passed - PLT in normal range and within 360 days    Platelets  Date Value Ref Range Status  01/31/2022 248 150 - 450 x10E3/uL Final         Passed - HCT in normal range and within 360 days    Hematocrit  Date Value Ref Range Status  01/31/2022 40.6 34.0 - 46.6 % Final         Passed - eGFR is 30 or above and within 360 days    EGFR (African American)  Date Value Ref Range Status  11/05/2011 >60  Final   GFR calc Af Amer  Date Value Ref Range Status  01/13/2020 84 >59 mL/min/1.73 Final    Comment:    **[facility] currently reports eGFR in compliance with the current**   recommendations of the [employer]. [facility] will   update reporting as new guidelines are published from the NKF-ASN   Task force.    EGFR (Non-African Amer.)  Date Value Ref Range Status  11/05/2011 >60  Final    Comment:    eGFR values <64m/min/1.73 m2 may be an indication of chronic kidney disease (CKD). Calculated eGFR is useful in patients with stable renal function. The eGFR calculation will not be reliable in acutely ill patients when serum creatinine is changing rapidly. It is not useful in  patients on dialysis. The eGFR calculation may not  be applicable to patients at the low and high extremes of body sizes, pregnant women, and vegetarians.    GFR calc non Af Amer  Date Value Ref Range Status  01/13/2020 73 >59 mL/min/1.73 Final   eGFR  Date Value Ref Range Status  01/31/2022 73 >59 mL/min/1.73 Final         Passed - Patient is not pregnant      Passed - Valid encounter within last 12 months    Recent Outpatient Visits           1 month ago Hypothyroidism, unspecified type   [place], DO   7 months ago Routine general medical examination at a health care facility   [facility] [place]   9 months ago Insomnia, unspecified type   [facility]   11 months ago RLS (restless legs syndrome)   [place]   1 year ago Abnormal EKG   [place] DO

## 2022-05-09 ENCOUNTER — Other Ambulatory Visit: Payer: Self-pay | Admitting: Family Medicine

## 2022-05-09 ENCOUNTER — Other Ambulatory Visit: Payer: Self-pay | Admitting: Nurse Practitioner

## 2022-05-11 NOTE — Telephone Encounter (Signed)
Requested medication (s) are due for refill today:routing for review  Requested medication (s) are on the active medication list: yes  Last refill:  multiple dates  Future visit scheduled: yes  Notes to clinic:  Manual Review: Route requests for 50,000 IU strength to the provider for Vitamin D. Zovirax was refilled as a short supply, should patient continue to take. Routing for review.     Requested Prescriptions  Pending Prescriptions Disp Refills   Vitamin D, Ergocalciferol, (DRISDOL) 1.25 MG (50000 UNIT) CAPS capsule [Pharmacy Med Name: VITAMIN D2 1.25MG(50,000 UNIT)] 4 capsule 2    Sig: TAKE 1 CAPSULE (50,000 UNITS TOTAL) BY MOUTH EVERY 7 (SEVEN) DAYS     Endocrinology:  Vitamins - Vitamin D Supplementation 2 Failed - 05/09/2022  7:19 PM      Failed - Manual Review: Route requests for 50,000 IU strength to the provider      Passed - Ca in normal range and within 360 days    Calcium  Date Value Ref Range Status  01/31/2022 9.1 8.7 - 10.2 mg/dL Final   Calcium, Total  Date Value Ref Range Status  11/05/2011 8.3 (L) 8.5 - 10.1 mg/dL Final         Passed - Vitamin D in normal range and within 360 days    Vit D, 25-Hydroxy  Date Value Ref Range Status  01/31/2022 38.9 30.0 - 100.0 ng/mL Final    Comment:    Vitamin D deficiency has been defined by the Institute of Medicine and an Endocrine Society practice guideline as a level of serum 25-OH vitamin D less than 20 ng/mL (1,2). The Endocrine Society went on to further define vitamin D insufficiency as a level between 21 and 29 ng/mL (2). 1. IOM (Institute of Medicine). 2010. Dietary reference    intakes for calcium and D. [place]: The    [employer]. 2. Holick MF, [place], Bischoff-Ferrari HA, et al.    Evaluation, treatment, and prevention of vitamin D    deficiency: an Endocrine Society clinical practice    guideline. JCEM. 2011 Jul; 96(7):1911-30.          Passed - Valid encounter within last  12 months    Recent Outpatient Visits           3 months ago Hypothyroidism, unspecified type   [place], Megan P, DO   9 months ago Routine general medical examination at a health care facility   [facility], [place] P, DO   11 months ago Insomnia, unspecified type   [facility], Megan P, DO   1 year ago RLS (restless legs syndrome)   [place], Megan P, DO   1 year ago Abnormal EKG   [facility], Megan P, DO               acyclovir (ZOVIRAX) 400 MG tablet [Pharmacy Med Name: ACYCLOVIR 400 MG TABLET] 15 tablet 4    Sig: TAKE 1 TABLET BY MOUTH 3 TIMES DAILY.     Antimicrobials:  Antiviral Agents - Anti-Herpetic Passed - 05/09/2022  7:19 PM      Passed - Valid encounter within last 12 months    Recent Outpatient Visits           3 months ago Hypothyroidism, unspecified type   [place], DO   9 months ago Routine general medical examination at a health care facility  [facility], Megan P, DO   11 months ago Insomnia, unspecified type   [place], Megan P, DO   1 year ago RLS (restless legs syndrome)   [place], Megan P, DO   1 year ago Abnormal EKG   [facility] [place], Megan P, DO              Refused Prescriptions Disp Refills   naproxen (NAPROSYN) 500 MG tablet [Pharmacy Med Name: NAPROXEN 500 MG TABLET] 180 tablet 0    Sig: TAKE 1 TABLET BY MOUTH TWICE A DAY WITH FOOD     Analgesics:  NSAIDS Failed - 05/09/2022  7:19 PM      Failed - Manual Review: Labs are only required if the patient has taken medication for more than 8 weeks.      Passed - Cr in normal range and within 360 days    Creatinine  Date Value Ref Range Status  11/05/2011 0.84 0.60 - 1.30 mg/dL Final   Creatinine, Ser  Date Value Ref Range Status  01/31/2022 0.93 0.57 -  1.00 mg/dL Final         Passed - HGB in normal range and within 360 days    Hemoglobin  Date Value Ref Range Status  01/31/2022 13.5 11.1 - 15.9 g/dL Final         Passed - PLT in normal range and within 360 days    Platelets  Date Value Ref Range Status  01/31/2022 248 150 - 450 x10E3/uL Final         Passed - HCT in normal range and within 360 days    Hematocrit  Date Value Ref Range Status  01/31/2022 40.6 34.0 - 46.6 % Final         Passed - eGFR is 30 or above and within 360 days    EGFR (African American)  Date Value Ref Range Status  11/05/2011 >60  Final   GFR calc Af Amer  Date Value Ref Range Status  01/13/2020 84 >59 mL/min/1.73 Final    Comment:    **[facility] currently reports eGFR in compliance with the current**   recommendations of the [employer]. [facility] will   update reporting as new guidelines are published from the NKF-ASN   Task force.    EGFR (Non-African Amer.)  Date Value Ref Range Status  11/05/2011 >60  Final    Comment:    eGFR values <10m/min/1.73 m2 may be an indication of chronic kidney disease (CKD). Calculated eGFR is useful in patients with stable renal function. The eGFR calculation will not be reliable in acutely ill patients when serum creatinine is changing rapidly. It is not useful in  patients on dialysis. The eGFR calculation may not be applicable to patients at the low and high extremes of body sizes, pregnant women, and vegetarians.    GFR calc non Af Amer  Date Value Ref Range Status  01/13/2020 73 >59 mL/min/1.73 Final   eGFR  Date Value Ref Range Status  01/31/2022 73 >59 mL/min/1.73 Final         Passed - Patient is not pregnant      Passed - Valid encounter within last 12 months    Recent Outpatient Visits           3 months ago Hypothyroidism, unspecified type   [place] P, DO   9 months ago Routine general medical examination at a health care  facility    [facility], [place] P, DO   11 months ago Insomnia, unspecified type   [place], Megan P, DO   1 year ago RLS (restless legs syndrome)   [place], DO   1 year ago Abnormal EKG   [place], [place], [place]

## 2022-05-11 NOTE — Telephone Encounter (Signed)
Unable to refill per protocol, Rx request for Naproxen is too soon. Last refill 03/20/22 for 90 days. Will refuse.  Requested Prescriptions  Pending Prescriptions Disp Refills   Vitamin D, Ergocalciferol, (DRISDOL) 1.25 MG (50000 UNIT) CAPS capsule [Pharmacy Med Name: VITAMIN D2 1.25MG(50,000 UNIT)] 4 capsule 2    Sig: TAKE 1 CAPSULE (50,000 UNITS TOTAL) BY MOUTH EVERY 7 (SEVEN) DAYS     Endocrinology:  Vitamins - Vitamin D Supplementation 2 Failed - 05/09/2022  7:19 PM      Failed - Manual Review: Route requests for 50,000 IU strength to the provider      Passed - Ca in normal range and within 360 days    Calcium  Date Value Ref Range Status  01/31/2022 9.1 8.7 - 10.2 mg/dL Final   Calcium, Total  Date Value Ref Range Status  11/05/2011 8.3 (L) 8.5 - 10.1 mg/dL Final         Passed - Vitamin D in normal range and within 360 days    Vit D, 25-Hydroxy  Date Value Ref Range Status  01/31/2022 38.9 30.0 - 100.0 ng/mL Final    Comment:    Vitamin D deficiency has been defined by the Institute of Medicine and an Endocrine Society practice guideline as a level of serum 25-OH vitamin D less than 20 ng/mL (1,2). The Endocrine Society went on to further define vitamin D insufficiency as a level between 21 and 29 ng/mL (2). 1. IOM (Institute of Medicine). 2010. Dietary reference    intakes for calcium and D. [place]: The    [employer]. 2. Holick MF, Binkley [place], Bischoff-Ferrari HA, et al.    Evaluation, treatment, and prevention of vitamin D    deficiency: an Endocrine Society clinical practice    guideline. JCEM. 2011 Jul; 96(7):1911-30.          Passed - Valid encounter within last 12 months    Recent Outpatient Visits           3 months ago Hypothyroidism, unspecified type   [place], Megan P, DO   9 months ago Routine general medical examination at a health care facility   [facility], [place] P, DO   11 months  ago Insomnia, unspecified type   [facility], Megan P, DO   1 year ago RLS (restless legs syndrome)   [place], Megan P, DO   1 year ago Abnormal EKG   [facility], DO               acyclovir (ZOVIRAX) 400 MG tablet [Pharmacy Med Name: ACYCLOVIR 400 MG TABLET] 15 tablet 4    Sig: TAKE 1 TABLET BY MOUTH 3 TIMES DAILY.     Antimicrobials:  Antiviral Agents - Anti-Herpetic Passed - 05/09/2022  7:19 PM      Passed - Valid encounter within last 12 months    Recent Outpatient Visits           3 months ago Hypothyroidism, unspecified type   [facility], Megan P, DO   9 months ago Routine general medical examination at a health care facility   [facility], [place] P, DO   11 months ago Insomnia, unspecified type   [facility], Megan P, DO   1 year ago RLS (restless legs syndrome)   [place], Megan P, DO   1 year ago Abnormal EKG  [place], Megan P, DO              Refused Prescriptions Disp Refills   naproxen (NAPROSYN) 500 MG tablet [Pharmacy Med Name: NAPROXEN 500 MG TABLET] 180 tablet 0    Sig: TAKE 1 TABLET BY MOUTH TWICE A DAY WITH FOOD     Analgesics:  NSAIDS Failed - 05/09/2022  7:19 PM      Failed - Manual Review: Labs are only required if the patient has taken medication for more than 8 weeks.      Passed - Cr in normal range and within 360 days    Creatinine  Date Value Ref Range Status  11/05/2011 0.84 0.60 - 1.30 mg/dL Final   Creatinine, Ser  Date Value Ref Range Status  01/31/2022 0.93 0.57 - 1.00 mg/dL Final         Passed - HGB in normal range and within 360 days    Hemoglobin  Date Value Ref Range Status  01/31/2022 13.5 11.1 - 15.9 g/dL Final         Passed - PLT in normal range and within 360 days    Platelets  Date Value Ref Range Status  01/31/2022 248 150  - 450 x10E3/uL Final         Passed - HCT in normal range and within 360 days    Hematocrit  Date Value Ref Range Status  01/31/2022 40.6 34.0 - 46.6 % Final         Passed - eGFR is 30 or above and within 360 days    EGFR (African American)  Date Value Ref Range Status  11/05/2011 >60  Final   GFR calc Af Amer  Date Value Ref Range Status  01/13/2020 84 >59 mL/min/1.73 Final    Comment:    **[facility] currently reports eGFR in compliance with the current**   recommendations of the [employer]. [facility] will   update reporting as new guidelines are published from the NKF-ASN   Task force.    EGFR (Non-African Amer.)  Date Value Ref Range Status  11/05/2011 >60  Final    Comment:    eGFR values <64m/min/1.73 m2 may be an indication of chronic kidney disease (CKD). Calculated eGFR is useful in patients with stable renal function. The eGFR calculation will not be reliable in acutely ill patients when serum creatinine is changing rapidly. It is not useful in  patients on dialysis. The eGFR calculation may not be applicable to patients at the low and high extremes of body sizes, pregnant women, and vegetarians.    GFR calc non Af Amer  Date Value Ref Range Status  01/13/2020 73 >59 mL/min/1.73 Final   eGFR  Date Value Ref Range Status  01/31/2022 73 >59 mL/min/1.73 Final         Passed - Patient is not pregnant      Passed - Valid encounter within last 12 months    Recent Outpatient Visits           3 months ago Hypothyroidism, unspecified type   [place] P, DO   9 months ago Routine general medical examination at a health care facility   [facility] [place]   11 months ago Insomnia, unspecified type   [facility]   1 year ago RLS (restless legs syndrome)   [place], DO   1 year ago Abnormal  EKG   [facility]  [place], [place], [place]

## 2022-05-11 NOTE — Telephone Encounter (Signed)
Requested medication (s) are due for refill today: yes  Requested medication (s) are on the active medication list: yes  Last refill:  01/22/22 #30 2 RF  Future visit scheduled: no  Notes to clinic:  med not delegated to NT to RF   Requested Prescriptions  Pending Prescriptions Disp Refills   cyclobenzaprine (FLEXERIL) 10 MG tablet [Pharmacy Med Name: CYCLOBENZAPRINE 10 MG TABLET] 30 tablet 2    Sig: TAKE 1 TABLET BY MOUTH EVERYDAY AT BEDTIME     Not Delegated - Analgesics:  Muscle Relaxants Failed - 05/09/2022  7:19 PM      Failed - This refill cannot be delegated      Passed - Valid encounter within last 6 months    Recent Outpatient Visits           3 months ago Hypothyroidism, unspecified type   [facility] [place], Megan P, DO   9 months ago Routine general medical examination at a health care facility   [facility], [place] P, DO   11 months ago Insomnia, unspecified type   [facility] [place], Megan P, DO   1 year ago RLS (restless legs syndrome)   [facility] [place], Megan P, DO   1 year ago Abnormal EKG   [facility] [place], [place], DO

## 2022-06-05 ENCOUNTER — Encounter: Payer: Self-pay | Admitting: Family Medicine

## 2022-07-26 ENCOUNTER — Telehealth: Payer: BC Managed Care – PPO | Admitting: Physician Assistant

## 2022-07-26 DIAGNOSIS — T698XXA Other specified effects of reduced temperature, initial encounter: Secondary | ICD-10-CM

## 2022-07-26 MED ORDER — MUPIROCIN 2 % EX OINT: 1.0000 | TOPICAL_OINTMENT | Freq: Two times a day (BID) | CUTANEOUS | 0 refills | Status: AC

## 2022-07-26 NOTE — Progress Notes (Signed)
I have spent 5 minutes in review of e-visit questionnaire, review and updating patient chart, medical decision making and response to patient.   Kaidance Pantoja Cody Cyann Venti, PA-C    

## 2022-07-26 NOTE — Progress Notes (Signed)
E Visit for Rash  We are sorry that you are not feeling well. Here is how we plan to help!  Based on what you have shared with me it seems you have some irritation and chafing of the skin due to frequent runny nose and wiping/blowing the nose.  Thankfully I do not see any signs of secondary bacterial infection of the skin.  You want to try to keep the skin clean and dry.  I highly recommend a thin layer of petroleum jelly (Vaseline) over the area and just inside the nostrils, doing this a couple of times a day for the next few days.  This will help to keep the area moisturized and protected, both from the weather and your runny nose/wiping the nose.  For your cold symptoms, make sure that you are staying well-hydrated and trying to get plenty of rest.  You can do a saline nasal rinse over-the-counter to flush out your nasal passages.  If you do not have any history of glaucoma or elevated eye pressures, you can use an over-the-counter nasal steroid like Flonase as well.   GET HELP RIGHT AWAY IF:  Symptoms don't go away after treatment. Severe itching that persists. If you rash spreads or swells. If you rash begins to smell. If it blisters and opens or develops a yellow-brown crust. You develop a fever. You have a sore throat. You become short of breath.  MAKE SURE YOU:  Understand these instructions. Will watch your condition. Will get help right away if you are not doing well or get worse.  Thank you for choosing an e-visit.  Your e-visit answers were reviewed by a board certified advanced clinical practitioner to complete your personal care plan. Depending upon the condition, your plan could have included both over the counter or prescription medications.  Please review your pharmacy choice. Make sure the pharmacy is open so you can pick up prescription now. If there is a problem, you may contact your provider through [employer] and have the prescription routed to another  pharmacy.  Your safety is important to [place]. If you have drug allergies check your prescription carefully.   For the next 24 hours you can use MyChart to ask questions about today's visit, request a non-urgent call back, or ask for a work or school excuse. You will get an email in the next two days asking about your experience. I hope that your e-visit has been valuable and will speed your recovery.

## 2022-07-26 NOTE — Addendum Note (Signed)
Addended by: Brunetta Jeans on: 07/26/2022 10:32 AM   Modules accepted: Orders

## 2022-08-02 ENCOUNTER — Encounter: Payer: Commercial Managed Care - PPO | Admitting: Family Medicine

## 2022-08-05 ENCOUNTER — Encounter: Payer: Commercial Managed Care - PPO | Admitting: Family Medicine

## 2022-08-23 ENCOUNTER — Ambulatory Visit (INDEPENDENT_AMBULATORY_CARE_PROVIDER_SITE_OTHER): Payer: BC Managed Care – PPO | Admitting: Family Medicine

## 2022-08-23 ENCOUNTER — Encounter: Payer: Self-pay | Admitting: Family Medicine

## 2022-08-23 VITALS — BP 114/75 | HR 67 | Temp 98.1°F | Ht 61.5 in | Wt 140.3 lb

## 2022-08-23 DIAGNOSIS — Z0000 Encounter for general adult medical examination without abnormal findings: Secondary | ICD-10-CM

## 2022-08-23 DIAGNOSIS — E782 Mixed hyperlipidemia: Secondary | ICD-10-CM

## 2022-08-23 DIAGNOSIS — Z1211 Encounter for screening for malignant neoplasm of colon: Secondary | ICD-10-CM

## 2022-08-23 DIAGNOSIS — G4700 Insomnia, unspecified: Secondary | ICD-10-CM

## 2022-08-23 DIAGNOSIS — E559 Vitamin D deficiency, unspecified: Secondary | ICD-10-CM

## 2022-08-23 DIAGNOSIS — E039 Hypothyroidism, unspecified: Secondary | ICD-10-CM | POA: Diagnosis not present

## 2022-08-23 MED ORDER — CYCLOBENZAPRINE HCL 10 MG PO TABS: 10.0000 mg | ORAL_TABLET | Freq: Every day | ORAL | 2 refills | Status: DC

## 2022-08-23 MED ORDER — NAPROXEN 500 MG PO TABS: 500.0000 mg | ORAL_TABLET | Freq: Two times a day (BID) | ORAL | 1 refills | Status: DC

## 2022-08-23 MED ORDER — FERROUS SULFATE 325 (65 FE) MG PO TABS: 325.0000 mg | ORAL_TABLET | Freq: Every day | ORAL | 0 refills | Status: DC

## 2022-08-23 MED ORDER — HYDROCORTISONE (PERIANAL) 2.5 % EX CREA: 1.0000 | TOPICAL_CREAM | Freq: Two times a day (BID) | CUTANEOUS | 0 refills | Status: AC

## 2022-08-23 MED ORDER — ACYCLOVIR 400 MG PO TABS: 400.0000 mg | ORAL_TABLET | Freq: Three times a day (TID) | ORAL | 4 refills | Status: DC

## 2022-08-23 MED ORDER — ESZOPICLONE 1 MG PO TABS: 1.0000 mg | ORAL_TABLET | Freq: Every evening | ORAL | 5 refills | Status: DC | PRN

## 2022-08-23 NOTE — Progress Notes (Unsigned)
Ht 5' 1.5" (1.562 m)   Wt 140 lb 4.8 oz (63.6 kg)   LMP  (LMP Unknown)   BMI 26.08 kg/m    Subjective:    Patient ID: Vicki Schultz, female    DOB: 05-Aug-1970, 52 y.o.   MRN: [id number]  HPI: Vicki Schultz is a 52 y.o. female presenting on 08/23/2022 for comprehensive medical examination. Current medical complaints include:{Blank single:19197::"none","***"}  She currently lives with: Menopausal Symptoms: {Blank single:19197::"yes","no"}  Depression Screen done today and results listed below:     01/31/2022    3:19 PM 07/30/2021    1:18 PM 04/24/2021    1:47 PM 11/20/2017   10:08 AM 10/24/2017    1:27 PM  Depression screen PHQ 2/9  Decreased Interest 0 0 0 0 0  Down, Depressed, Hopeless 0 0 0 0 0  PHQ - 2 Score 0 0 0 0 0  Altered sleeping 3 0 3  0  Tired, decreased energy 0 0 3  0  Change in appetite 1 0 0  0  Feeling bad or failure about yourself  0 0 0  0  Trouble concentrating 1 0 0  0  Moving slowly or fidgety/restless 1 0 0  0  Suicidal thoughts 0 0 0  0  PHQ-9 Score 6 0 6  0  Difficult doing work/chores Not difficult at all    Not difficult at all    The patient {has/does not have:19849} a history of falls. I {did/did not:19850} complete a risk assessment for falls. A plan of care for falls {was/was not:19852} documented.   Past Medical History:  Past Medical History:  Diagnosis Date   Thyroid disease     Surgical History:  Past Surgical History:  Procedure Laterality Date   CESAREAN SECTION     ROTATOR CUFF REPAIR Right 08/03/14   TUBAL LIGATION      Medications:  Current Outpatient Medications on File Prior to Visit  Medication Sig   acyclovir (ZOVIRAX) 400 MG tablet TAKE 1 TABLET BY MOUTH 3 TIMES DAILY.   cyclobenzaprine (FLEXERIL) 10 MG tablet TAKE 1 TABLET BY MOUTH EVERYDAY AT BEDTIME   eszopiclone (LUNESTA) 1 MG TABS tablet Take 1 tablet (1 mg total) by mouth at bedtime as needed for sleep. Take immediately before bedtime   ferrous sulfate  325 (65 FE) MG tablet TAKE 1 TABLET BY MOUTH EVERY DAY   levothyroxine (SYNTHROID) 88 MCG tablet Take 1 tablet (88 mcg total) by mouth daily before breakfast.   lidocaine (LIDODERM) 5 % Place 1 patch onto the skin daily. Remove & Discard patch within 12 hours or as directed by MD   mupirocin ointment (BACTROBAN) 2 % Apply 1 Application topically 2 (two) times daily.   naproxen (NAPROSYN) 500 MG tablet TAKE 1 TABLET BY MOUTH TWICE DAILY WITH A MEAL   Vitamin D, Ergocalciferol, (DRISDOL) 1.25 MG (50000 UNIT) CAPS capsule TAKE 1 CAPSULE (50,000 UNITS TOTAL) BY MOUTH EVERY 7 (SEVEN) DAYS   EPINEPHrine 0.3 mg/0.3 mL IJ SOAJ injection INJ 1 SYRINGE IM ONCE UTD FOR 1 DOSE (Patient not taking: Reported on 01/31/2022)   No current facility-administered medications on file prior to visit.    Allergies:  Allergies  Allergen Reactions   Flagyl [Metronidazole] Anaphylaxis   Codeine     Abdominal pain   Trazodone And Nefazodone Other (See Comments)    depression    Social History:  Social History   Socioeconomic History   Marital status: Single  Spouse name: Not on file   Number of children: Not on file   Years of education: Not on file   Highest education level: Not on file  Occupational History   Not on file  Tobacco Use   Smoking status: Former    Packs/day: .5    Types: Cigarettes   Smokeless tobacco: Never   Tobacco comments:    Patient Vapes  Vaping Use   Vaping Use: Every day  Substance and Sexual Activity   Alcohol use: Yes    Alcohol/week: 0.0 standard drinks of alcohol    Comment: two or less per week   Drug use: No   Sexual activity: Yes  Other Topics Concern   Not on file  Social History Narrative   Not on file   Social Determinants of Health   Financial Resource Strain: Not on file  Food Insecurity: Not on file  Transportation Needs: Not on file  Physical Activity: Not on file  Stress: Not on file  Social Connections: Not on file  Intimate Partner Violence:  Not on file   Social History   Tobacco Use  Smoking Status Former   Packs/day: .5   Types: Cigarettes  Smokeless Tobacco Never  Tobacco Comments   Patient Vapes   Social History   Substance and Sexual Activity  Alcohol Use Yes   Alcohol/week: 0.0 standard drinks of alcohol   Comment: two or less per week    Family History:  Family History  Problem Relation Age of Onset   Cancer Mother    Hypertension Mother    Mental illness Mother    Thyroid disease Mother    Breast cancer Mother 11   Diabetes Father    Arthritis Sister    Hypertension Sister     Past medical history, surgical history, medications, allergies, family history and social history reviewed with patient today and changes made to appropriate areas of the chart.   Review of Systems  Constitutional: Negative.   HENT: Negative.    Eyes: Negative.   Respiratory: Negative.    Cardiovascular: Negative.   Gastrointestinal: Negative.        Itchy anus at night  Genitourinary: Negative.   Musculoskeletal:  Positive for myalgias. Negative for back pain, falls, joint pain and neck pain.  Skin: Negative.        Spot on R ankle  Neurological: Negative.   Endo/Heme/Allergies:  Negative for environmental allergies and polydipsia. Bruises/bleeds easily.  Psychiatric/Behavioral: Negative.     All other ROS negative except what is listed above and in the HPI.      Objective:    Ht 5' 1.5" (1.562 m)   Wt 140 lb 4.8 oz (63.6 kg)   LMP  (LMP Unknown)   BMI 26.08 kg/m   Wt Readings from Last 3 Encounters:  08/23/22 140 lb 4.8 oz (63.6 kg)  01/31/22 145 lb 3.2 oz (65.9 kg)  07/30/21 147 lb 12.8 oz (67 kg)    Physical Exam  Results for orders placed or performed in visit on 01/31/22  CBC with Differential/Platelet  Result Value Ref Range   WBC 7.0 3.4 - 10.8 x10E3/uL   RBC 4.23 3.77 - 5.28 x10E6/uL   Hemoglobin 13.5 11.1 - 15.9 g/dL   Hematocrit 16.1 09.6 - 46.6 %   MCV 96 79 - 97 fL   MCH 31.9 26.6 -  33.0 pg   MCHC 33.3 31.5 - 35.7 g/dL   RDW 04.5 40.9 - 81.1 %   Platelets  248 150 - 450 x10E3/uL   Neutrophils 37 Not Estab. %   Lymphs 48 Not Estab. %   Monocytes 7 Not Estab. %   Eos 7 Not Estab. %   Basos 1 Not Estab. %   Neutrophils Absolute 2.6 1.4 - 7.0 x10E3/uL   Lymphocytes Absolute 3.4 (H) 0.7 - 3.1 x10E3/uL   Monocytes Absolute 0.5 0.1 - 0.9 x10E3/uL   EOS (ABSOLUTE) 0.5 (H) 0.0 - 0.4 x10E3/uL   Basophils Absolute 0.1 0.0 - 0.2 x10E3/uL   Immature Granulocytes 0 Not Estab. %   Immature Grans (Abs) 0.0 0.0 - 0.1 x10E3/uL  Comprehensive metabolic panel  Result Value Ref Range   Glucose 104 (H) 70 - 99 mg/dL   BUN 13 6 - 24 mg/dL   Creatinine, Ser 4.78 0.57 - 1.00 mg/dL   eGFR 73 >29 FA/OZH/0.86   BUN/Creatinine Ratio 14 9 - 23   Sodium 142 134 - 144 mmol/L   Potassium 3.7 3.5 - 5.2 mmol/L   Chloride 104 96 - 106 mmol/L   CO2 22 20 - 29 mmol/L   Calcium 9.1 8.7 - 10.2 mg/dL   Total Protein 6.6 6.0 - 8.5 g/dL   Albumin 4.1 3.8 - 4.9 g/dL   Globulin, Total 2.5 1.5 - 4.5 g/dL   Albumin/Globulin Ratio 1.6 1.2 - 2.2   Bilirubin Total 0.2 0.0 - 1.2 mg/dL   Alkaline Phosphatase 90 44 - 121 IU/L   AST 23 0 - 40 IU/L   ALT 27 0 - 32 IU/L  Lipid Panel w/o Chol/HDL Ratio  Result Value Ref Range   Cholesterol, Total 253 (H) 100 - 199 mg/dL   Triglycerides 578 (H) 0 - 149 mg/dL   HDL 31 (L) >46 mg/dL   VLDL Cholesterol Cal 87 (H) 5 - 40 mg/dL   LDL Chol Calc (NIH) 962 (H) 0 - 99 mg/dL  TSH  Result Value Ref Range   TSH 0.702 0.450 - 4.500 uIU/mL  VITAMIN D 25 Hydroxy (Vit-D Deficiency, Fractures)  Result Value Ref Range   Vit D, 25-Hydroxy 38.9 30.0 - 100.0 ng/mL      Assessment & Plan:   Problem List Items Addressed This Visit       Endocrine   Hypothyroidism   Relevant Orders   TSH     Other   Mixed hyperlipidemia   Relevant Orders   Comprehensive metabolic panel   Lipid Panel w/o Chol/HDL Ratio   Vitamin D deficiency   Relevant Orders   VITAMIN D 25  Hydroxy (Vit-D Deficiency, Fractures)   Other Visit Diagnoses     Routine general medical examination at a health care facility    -  Primary   Relevant Orders   CBC with Differential/Platelet   Comprehensive metabolic panel   Lipid Panel w/o Chol/HDL Ratio   Urinalysis, Routine w reflex microscopic   TSH   VITAMIN D 25 Hydroxy (Vit-D Deficiency, Fractures)        Follow up plan: No follow-ups on file.   LABORATORY TESTING:  - Pap smear: {Blank single:19197::"pap done","not applicable","up to date","done elsewhere"}  IMMUNIZATIONS:   - Tdap: Tetanus vaccination status reviewed: {tetanus status:315746}. - Influenza: {Blank single:19197::"Up to date","Administered today","Postponed to flu season","Refused","Given elsewhere"} - Pneumovax: {Blank single:19197::"Up to date","Administered today","Not applicable","Refused","Given elsewhere"} - Prevnar: {Blank single:19197::"Up to date","Administered today","Not applicable","Refused","Given elsewhere"} - COVID: {Blank single:19197::"Up to date","Administered today","Not applicable","Refused","Given elsewhere"} - HPV: {Blank single:19197::"Up to date","Administered today","Not applicable","Refused","Given elsewhere"} - Shingrix vaccine: {Blank single:19197::"Up to date","Administered today","Not applicable","Refused","Given elsewhere"}  SCREENING: -Mammogram: {Blank single:19197::"Up to date","Ordered today","Not applicable","Refused","Done elsewhere"}  - Colonoscopy: {Blank single:19197::"Up to date","Ordered today","Not applicable","Refused","Done elsewhere"}  - Bone Density: {Blank single:19197::"Up to date","Ordered today","Not applicable","Refused","Done elsewhere"}    PATIENT COUNSELING:   Advised to take 1 mg of folate supplement per day if capable of pregnancy.   Sexuality: Discussed sexually transmitted diseases, partner selection, use of condoms, avoidance of unintended pregnancy  and contraceptive alternatives.   Advised  to avoid cigarette smoking.  I discussed with the patient that most people either abstain from alcohol or drink within safe limits (<=14/week and <=4 drinks/occasion for males, <=7/weeks and <= 3 drinks/occasion for females) and that the risk for alcohol disorders and other health effects rises proportionally with the number of drinks per week and how often a drinker exceeds daily limits.  Discussed cessation/primary prevention of drug use and availability of treatment for abuse.   Diet: Encouraged to adjust caloric intake to maintain  or achieve ideal body weight, to reduce intake of dietary saturated fat and total fat, to limit sodium intake by avoiding high sodium foods and not adding table salt, and to maintain adequate dietary potassium and calcium preferably from fresh fruits, vegetables, and low-fat dairy products.    stressed the importance of regular exercise  Injury prevention: Discussed safety belts, safety helmets, smoke detector, smoking near bedding or upholstery.   Dental health: Discussed importance of regular tooth brushing, flossing, and dental visits.    NEXT PREVENTATIVE PHYSICAL DUE IN 1 YEAR. No follow-ups on file.

## 2022-08-24 LAB — COMPREHENSIVE METABOLIC PANEL
ALT: 34 IU/L — ABNORMAL HIGH (ref 0–32)
AST: 24 IU/L (ref 0–40)
Albumin/Globulin Ratio: 1.9 (ref 1.2–2.2)
Albumin: 4.6 g/dL (ref 3.8–4.9)
Alkaline Phosphatase: 112 IU/L (ref 44–121)
BUN/Creatinine Ratio: 15 (ref 9–23)
BUN: 14 mg/dL (ref 6–24)
Bilirubin Total: <0.2 mg/dL (ref 0.0–1.2)
CO2: 22 mmol/L (ref 20–29)
Calcium: 9.1 mg/dL (ref 8.7–10.2)
Chloride: 100 mmol/L (ref 96–106)
Creatinine, Ser: 0.92 mg/dL (ref 0.57–1.00)
Globulin, Total: 2.4 g/dL (ref 1.5–4.5)
Glucose: 119 mg/dL — ABNORMAL HIGH (ref 70–99)
Potassium: 3.7 mmol/L (ref 3.5–5.2)
Sodium: 140 mmol/L (ref 134–144)
Total Protein: 7 g/dL (ref 6.0–8.5)
eGFR: 74 mL/min/{1.73_m2} (ref >59)

## 2022-08-24 LAB — TSH: TSH: 0.069 u[IU]/mL — ABNORMAL LOW (ref 0.450–4.500)

## 2022-08-24 LAB — CBC WITH DIFFERENTIAL/PLATELET
Basophils Absolute: 0.1 10*3/uL (ref 0.0–0.2)
Basos: 1 %
EOS (ABSOLUTE): 0.4 10*3/uL (ref 0.0–0.4)
Eos: 5 %
Hematocrit: 39.7 % (ref 34.0–46.6)
Hemoglobin: 13.9 g/dL (ref 11.1–15.9)
Immature Grans (Abs): 0 10*3/uL (ref 0.0–0.1)
Immature Granulocytes: 0 %
Lymphocytes Absolute: 4.2 10*3/uL — ABNORMAL HIGH (ref 0.7–3.1)
Lymphs: 48 %
MCH: 33.1 pg — ABNORMAL HIGH (ref 26.6–33.0)
MCHC: 35 g/dL (ref 31.5–35.7)
MCV: 95 fL (ref 79–97)
Monocytes Absolute: 0.5 10*3/uL (ref 0.1–0.9)
Monocytes: 6 %
Neutrophils Absolute: 3.4 10*3/uL (ref 1.4–7.0)
Neutrophils: 40 %
Platelets: 245 10*3/uL (ref 150–450)
RBC: 4.2 x10E6/uL (ref 3.77–5.28)
RDW: 12.4 % (ref 11.7–15.4)
WBC: 8.5 10*3/uL (ref 3.4–10.8)

## 2022-08-24 LAB — URINALYSIS, ROUTINE W REFLEX MICROSCOPIC
Bilirubin, UA: NEGATIVE
Glucose, UA: NEGATIVE
Ketones, UA: NEGATIVE
Leukocytes,UA: NEGATIVE
Nitrite, UA: NEGATIVE
Protein,UA: NEGATIVE
RBC, UA: NEGATIVE
Specific Gravity, UA: 1.01 (ref 1.005–1.030)
Urobilinogen, Ur: 0.2 mg/dL (ref 0.2–1.0)
pH, UA: 5 (ref 5.0–7.5)

## 2022-08-24 LAB — LIPID PANEL W/O CHOL/HDL RATIO
Cholesterol, Total: 238 mg/dL — ABNORMAL HIGH (ref 100–199)
HDL: 42 mg/dL (ref >39)
LDL Chol Calc (NIH): 151 mg/dL — ABNORMAL HIGH (ref 0–99)
Triglycerides: 246 mg/dL — ABNORMAL HIGH (ref 0–149)
VLDL Cholesterol Cal: 45 mg/dL — ABNORMAL HIGH (ref 5–40)

## 2022-08-24 LAB — VITAMIN D 25 HYDROXY (VIT D DEFICIENCY, FRACTURES): Vit D, 25-Hydroxy: 53.5 ng/mL (ref 30.0–100.0)

## 2022-08-25 NOTE — Assessment & Plan Note (Signed)
Rechecking labs today. Await results. Treat as needed.  °

## 2022-08-25 NOTE — Assessment & Plan Note (Signed)
Under good control on current regimen. Continue current regimen. Continue to monitor. Call with any concerns. Refills given.   

## 2022-08-26 ENCOUNTER — Other Ambulatory Visit: Payer: Self-pay | Admitting: Family Medicine

## 2022-08-26 ENCOUNTER — Telehealth: Payer: Self-pay

## 2022-08-26 DIAGNOSIS — Z1231 Encounter for screening mammogram for malignant neoplasm of breast: Secondary | ICD-10-CM

## 2022-08-26 DIAGNOSIS — M8580 Other specified disorders of bone density and structure, unspecified site: Secondary | ICD-10-CM

## 2022-08-26 DIAGNOSIS — E039 Hypothyroidism, unspecified: Secondary | ICD-10-CM

## 2022-08-26 DIAGNOSIS — Z13820 Encounter for screening for osteoporosis: Secondary | ICD-10-CM

## 2022-08-26 MED ORDER — LEVOTHYROXINE SODIUM 75 MCG PO TABS: 75.0000 ug | ORAL_TABLET | Freq: Every day | ORAL | 1 refills | Status: DC

## 2022-08-26 NOTE — Telephone Encounter (Signed)
Patient is scheduled on 10/24/22 at 1:20 PM. Patient was notified via MyChart.

## 2022-08-26 NOTE — Telephone Encounter (Signed)
Spoke with Angie from [facility] and she says she does not see where they patient has had any imaging with their facility in the past few years. Please advise?

## 2022-08-26 NOTE — Telephone Encounter (Signed)
Cool- can you please get her scheduled for mammo and dexa. Orders in

## 2022-08-26 NOTE — Telephone Encounter (Signed)
-----   Message from Dorcas Carrow, [place] sent at 08/23/2022  4:42 PM EDT ----- She thinks she had a DEXA or mammo at [facility]- can we check?

## 2022-09-03 ENCOUNTER — Other Ambulatory Visit (HOSPITAL_COMMUNITY)
Admission: RE | Admit: 2022-09-03 | Discharge: 2022-09-03 | Disposition: A | Discharge status: Home / Self Care | Payer: BC Managed Care – PPO | Source: Ambulatory Visit | Attending: Family Medicine | Admitting: Family Medicine

## 2022-09-03 ENCOUNTER — Ambulatory Visit: Payer: BC Managed Care – PPO | Admitting: Family Medicine

## 2022-09-03 ENCOUNTER — Encounter: Payer: Self-pay | Admitting: Family Medicine

## 2022-09-03 VITALS — BP 105/74 | HR 71 | Wt 142.8 lb

## 2022-09-03 DIAGNOSIS — L820 Inflamed seborrheic keratosis: Secondary | ICD-10-CM | POA: Diagnosis not present

## 2022-09-03 DIAGNOSIS — D485 Neoplasm of uncertain behavior of skin: Secondary | ICD-10-CM | POA: Diagnosis not present

## 2022-09-03 NOTE — Progress Notes (Signed)
BP 105/74   Pulse 71   Wt 142 lb 12.8 oz (64.8 kg)   LMP  (LMP Unknown)   SpO2 98%   BMI 26.54 kg/m    Subjective:    Patient ID: Vicki Schultz, female    DOB: 09-30-1970, 52 y.o.   MRN: [id number]  HPI: Vicki Schultz is a 52 y.o. female  Chief Complaint  Patient presents with   shave biopsy   SKIN LESION Duration: weeks Location: lateral R ankle Painful: no Itching: no Onset: gradual Context: not changing History of skin cancer: no History of precancerous skin lesions: no Family history of skin cancer: no  Relevant past medical, surgical, family and social history reviewed and updated as indicated. Interim medical history since our last visit reviewed. Allergies and medications reviewed and updated.  Review of Systems  Constitutional: Negative.   Respiratory: Negative.    Cardiovascular: Negative.   Gastrointestinal: Negative.   Musculoskeletal: Negative.   Skin:        Skin lesion on R ankle  Psychiatric/Behavioral: Negative.      Per HPI unless specifically indicated above     Objective:    BP 105/74   Pulse 71   Wt 142 lb 12.8 oz (64.8 kg)   LMP  (LMP Unknown)   SpO2 98%   BMI 26.54 kg/m   Wt Readings from Last 3 Encounters:  09/03/22 142 lb 12.8 oz (64.8 kg)  08/23/22 140 lb 4.8 oz (63.6 kg)  01/31/22 145 lb 3.2 oz (65.9 kg)    Physical Exam Vitals and nursing note reviewed.  Constitutional:      General: She is not in acute distress.    Appearance: Normal appearance. She is not ill-appearing, toxic-appearing or diaphoretic.  HENT:     Head: Normocephalic and atraumatic.     Right Ear: External ear normal.     Left Ear: External ear normal.     Nose: Nose normal.     Mouth/Throat:     Mouth: Mucous membranes are moist.     Pharynx: Oropharynx is clear.  Eyes:     General: No scleral icterus.       Right eye: No discharge.        Left eye: No discharge.     Extraocular Movements: Extraocular movements intact.      Conjunctiva/sclera: Conjunctivae normal.     Pupils: Pupils are equal, round, and reactive to light.  Cardiovascular:     Rate and Rhythm: Normal rate and regular rhythm.     Pulses: Normal pulses.     Heart sounds: Normal heart sounds. No murmur heard.    No friction rub. No gallop.  Pulmonary:     Effort: Pulmonary effort is normal. No respiratory distress.     Breath sounds: Normal breath sounds. No stridor. No wheezing, rhonchi or rales.  Chest:     Chest wall: No tenderness.  Musculoskeletal:        General: Normal range of motion.     Cervical back: Normal range of motion and neck supple.  Skin:    General: Skin is warm and dry.     Capillary Refill: Capillary refill takes less than 2 seconds.     Coloration: Skin is not jaundiced or pale.     Findings: No bruising, erythema, lesion or rash.     Comments: 1cm raised scaley flesh colored lesion on lateral R ankle  Neurological:     General: No focal deficit present.  Mental Status: She is alert and oriented to person, place, and time. Mental status is at baseline.  Psychiatric:        Mood and Affect: Mood normal.        Behavior: Behavior normal.        Thought Content: Thought content normal.        Judgment: Judgment normal.     Results for orders placed or performed in visit on 08/23/22  CBC with Differential/Platelet  Result Value Ref Range   WBC 8.5 3.4 - 10.8 x10E3/uL   RBC 4.20 3.77 - 5.28 x10E6/uL   Hemoglobin 13.9 11.1 - 15.9 g/dL   Hematocrit 96.0 45.4 - 46.6 %   MCV 95 79 - 97 fL   MCH 33.1 (H) 26.6 - 33.0 pg   MCHC 35.0 31.5 - 35.7 g/dL   RDW 09.8 11.9 - 14.7 %   Platelets 245 150 - 450 x10E3/uL   Neutrophils 40 Not Estab. %   Lymphs 48 Not Estab. %   Monocytes 6 Not Estab. %   Eos 5 Not Estab. %   Basos 1 Not Estab. %   Neutrophils Absolute 3.4 1.4 - 7.0 x10E3/uL   Lymphocytes Absolute 4.2 (H) 0.7 - 3.1 x10E3/uL   Monocytes Absolute 0.5 0.1 - 0.9 x10E3/uL   EOS (ABSOLUTE) 0.4 0.0 - 0.4  x10E3/uL   Basophils Absolute 0.1 0.0 - 0.2 x10E3/uL   Immature Granulocytes 0 Not Estab. %   Immature Grans (Abs) 0.0 0.0 - 0.1 x10E3/uL  Comprehensive metabolic panel  Result Value Ref Range   Glucose 119 (H) 70 - 99 mg/dL   BUN 14 6 - 24 mg/dL   Creatinine, Ser 8.29 0.57 - 1.00 mg/dL   eGFR 74 >56 OZ/HYQ/6.57   BUN/Creatinine Ratio 15 9 - 23   Sodium 140 134 - 144 mmol/L   Potassium 3.7 3.5 - 5.2 mmol/L   Chloride 100 96 - 106 mmol/L   CO2 22 20 - 29 mmol/L   Calcium 9.1 8.7 - 10.2 mg/dL   Total Protein 7.0 6.0 - 8.5 g/dL   Albumin 4.6 3.8 - 4.9 g/dL   Globulin, Total 2.4 1.5 - 4.5 g/dL   Albumin/Globulin Ratio 1.9 1.2 - 2.2   Bilirubin Total <0.2 0.0 - 1.2 mg/dL   Alkaline Phosphatase 112 44 - 121 IU/L   AST 24 0 - 40 IU/L   ALT 34 (H) 0 - 32 IU/L  Lipid Panel w/o Chol/HDL Ratio  Result Value Ref Range   Cholesterol, Total 238 (H) 100 - 199 mg/dL   Triglycerides 846 (H) 0 - 149 mg/dL   HDL 42 >96 mg/dL   VLDL Cholesterol Cal 45 (H) 5 - 40 mg/dL   LDL Chol Calc (NIH) 295 (H) 0 - 99 mg/dL  Urinalysis, Routine w reflex microscopic  Result Value Ref Range   Specific Gravity, UA 1.010 1.005 - 1.030   pH, UA 5.0 5.0 - 7.5   Color, UA Yellow Yellow   Appearance Ur Clear Clear   Leukocytes,UA Negative Negative   Protein,UA Negative Negative/Trace   Glucose, UA Negative Negative   Ketones, UA Negative Negative   RBC, UA Negative Negative   Bilirubin, UA Negative Negative   Urobilinogen, Ur 0.2 0.2 - 1.0 mg/dL   Nitrite, UA Negative Negative   Microscopic Examination Comment   TSH  Result Value Ref Range   TSH 0.069 (L) 0.450 - 4.500 uIU/mL  VITAMIN D 25 Hydroxy (Vit-D Deficiency, Fractures)  Result Value Ref Range  Vit D, 25-Hydroxy 53.5 30.0 - 100.0 ng/mL      Assessment & Plan:   Problem List Items Addressed This Visit   None Visit Diagnoses     Neoplasm of uncertain behavior of skin    -  Primary   Removed today as below. Call with any concerns.   Relevant  Orders   Surgical pathology      Skin Procedure  Procedure: Informed consent given.  Sterile prep of the area.  Area infiltrated with lidocaine with epinephrine.  Using a surgical blade, part of the upper dermis shaved off and sent  for pathology.  Area cauterized. Pt ed on scarring.  Diagnosis:   ICD-10-CM   1. Neoplasm of uncertain behavior of skin  D48.5 Surgical pathology   Removed today as below. Call with any concerns.      Lesion Location/Size: 1cm raised scaley flesh colored lesion on lateral R ankle Physician: MJ Consent:  Risks, benefits, and alternative treatments discussed and all questions were answered.  Patient elected to proceed and verbal consent obtained.  Description: Area prepped and draped using semi-sterile technique. Area locally anesthetized using 3 cc's of lidocaine 1% plain. Shave biopsy of lesion performed using a dermablade.  Adequate hemostastis achieved using Silver Nitrate. Wound dressed after application of bacitracin ointment.".  Post Procedure Instructions: Wound care instructions discussed and patient was instructed to keep area clean and dry.  Signs and symptoms of infection discussed, patient agrees to contact the office ASAP should they occur.  Dressing change recommended every other day.  Follow up plan: Return if symptoms worsen or fail to improve.

## 2022-09-05 LAB — SURGICAL PATHOLOGY

## 2022-09-11 ENCOUNTER — Telehealth: Payer: Self-pay | Admitting: Family Medicine

## 2022-09-11 ENCOUNTER — Encounter: Payer: Self-pay | Admitting: Family Medicine

## 2022-09-11 NOTE — Telephone Encounter (Signed)
Pt. Given surgical pathology results, verbalizes understanding. States the site is red and tender. Please advise.

## 2022-09-11 NOTE — Telephone Encounter (Signed)
Pt. Sent a picture through My Chart.

## 2022-10-09 ENCOUNTER — Other Ambulatory Visit: Payer: BC Managed Care – PPO

## 2022-10-09 DIAGNOSIS — E039 Hypothyroidism, unspecified: Secondary | ICD-10-CM | POA: Diagnosis not present

## 2022-10-10 LAB — TSH: TSH: 0.262 u[IU]/mL — ABNORMAL LOW (ref 0.450–4.500)

## 2022-10-11 ENCOUNTER — Other Ambulatory Visit: Payer: Self-pay | Admitting: Family Medicine

## 2022-10-11 DIAGNOSIS — E039 Hypothyroidism, unspecified: Secondary | ICD-10-CM

## 2022-10-11 MED ORDER — LEVOTHYROXINE SODIUM 50 MCG PO TABS: 50.0000 ug | ORAL_TABLET | Freq: Every day | ORAL | 1 refills | Status: DC

## 2022-10-24 ENCOUNTER — Ambulatory Visit
Admission: RE | Admit: 2022-10-24 | Discharge: 2022-10-24 | Disposition: A | Discharge status: Home / Self Care | Payer: BC Managed Care – PPO | Source: Ambulatory Visit | Attending: Family Medicine | Admitting: Family Medicine

## 2022-10-24 ENCOUNTER — Encounter: Payer: Self-pay | Admitting: Family Medicine

## 2022-10-24 DIAGNOSIS — M8580 Other specified disorders of bone density and structure, unspecified site: Secondary | ICD-10-CM | POA: Diagnosis not present

## 2022-10-24 DIAGNOSIS — M8588 Other specified disorders of bone density and structure, other site: Secondary | ICD-10-CM | POA: Diagnosis not present

## 2022-10-24 DIAGNOSIS — Z780 Asymptomatic menopausal state: Secondary | ICD-10-CM | POA: Diagnosis not present

## 2022-10-24 DIAGNOSIS — Z13820 Encounter for screening for osteoporosis: Secondary | ICD-10-CM | POA: Diagnosis not present

## 2022-10-24 DIAGNOSIS — Z1231 Encounter for screening mammogram for malignant neoplasm of breast: Secondary | ICD-10-CM | POA: Insufficient documentation

## 2022-11-11 NOTE — Telephone Encounter (Signed)
Please book lab only visit for week of 7/12. Order is already in

## 2022-11-12 NOTE — Telephone Encounter (Signed)
Called and scheduled appointment on 11/21/2022 @ 4:00 pm.

## 2022-11-21 ENCOUNTER — Other Ambulatory Visit: Payer: BC Managed Care – PPO

## 2022-11-21 DIAGNOSIS — E039 Hypothyroidism, unspecified: Secondary | ICD-10-CM

## 2022-11-22 ENCOUNTER — Other Ambulatory Visit: Payer: Self-pay | Admitting: Nurse Practitioner

## 2022-11-22 LAB — TSH: TSH: 20 u[IU]/mL — ABNORMAL HIGH (ref 0.450–4.500)

## 2022-11-22 MED ORDER — LEVOTHYROXINE SODIUM 75 MCG PO TABS: 75.0000 ug | ORAL_TABLET | Freq: Every day | ORAL | 4 refills | Status: DC

## 2022-11-22 NOTE — Progress Notes (Signed)
Contacted via MyChart -- will need lab only visit in 6 weeks please    Good evening Vicki Schultz, your labs have returned and thyroid level is now showing more hypothyroid levels. sluggish.  I am covering for Dr. Laural Benes while on vacation.  I have sent in increase in Levothyroxine to 75 MCG and we will need to recheck labs in 6 weeks.  I will leave this for Dr. Laural Benes to see changes.  Any questions?

## 2022-11-25 NOTE — Progress Notes (Signed)
Called and scheduled patient on 01/03/2023 @ 4:00 pm.

## 2022-12-10 ENCOUNTER — Ambulatory Visit: Payer: BC Managed Care – PPO | Admitting: Family Medicine

## 2022-12-10 ENCOUNTER — Encounter: Payer: Self-pay | Admitting: Family Medicine

## 2022-12-10 VITALS — BP 133/86 | HR 59 | Temp 97.7°F | Wt 140.0 lb

## 2022-12-10 DIAGNOSIS — M47816 Spondylosis without myelopathy or radiculopathy, lumbar region: Secondary | ICD-10-CM

## 2022-12-10 MED ORDER — MELOXICAM 15 MG PO TABS: 15.0000 mg | ORAL_TABLET | Freq: Every day | ORAL | 3 refills | Status: DC

## 2022-12-10 MED ORDER — CYCLOBENZAPRINE HCL 10 MG PO TABS: 10.0000 mg | ORAL_TABLET | Freq: Every day | ORAL | 2 refills | Status: DC

## 2022-12-10 MED ORDER — FAMOTIDINE 40 MG PO TABS
40.0000 mg | ORAL_TABLET | Freq: Every day | ORAL | 2 refills | Status: DC
Start: 2022-12-10 — End: 2023-04-09

## 2022-12-10 NOTE — Progress Notes (Signed)
BP 133/86   Pulse (!) 59   Temp 97.7 F (36.5 C) (Oral)   Wt 140 lb (63.5 kg)   LMP  (LMP Unknown)   SpO2 98%   BMI 26.02 kg/m    Subjective:    Patient ID: Vicki Schultz, female    DOB: Jun 08, 1970, 52 y.o.   MRN: [id number]  HPI: Vicki Schultz is a 51 y.o. female  Chief Complaint  Patient presents with   Back Pain    Pt states stopped taking her naprosyn because it was making her nausea and since she has stopped she has had back pain   BACK PAIN- stopped taking her naproxen due to abdominal pain.  Duration:  years, worse since she stopped the naproxen Mechanism of injury: unknown Location: bilateral and low back Onset: sudden Severity: moderate Quality: aching Frequency: intermittent Radiation: none Aggravating factors: nothing Alleviating factors: nothing Status: worse Treatments attempted: naproxen, stretches  Relief with NSAIDs?: significant Nighttime pain:  no Paresthesias / decreased sensation:  no Bowel / bladder incontinence:  no Fevers:  no Dysuria / urinary frequency:  no  Relevant past medical, surgical, family and social history reviewed and updated as indicated. Interim medical history since our last visit reviewed. Allergies and medications reviewed and updated.  Review of Systems  Constitutional: Negative.   Respiratory: Negative.    Cardiovascular: Negative.   Musculoskeletal:  Positive for back pain and myalgias. Negative for arthralgias, gait problem, joint swelling, neck pain and neck stiffness.  Skin: Negative.   Psychiatric/Behavioral: Negative.      Per HPI unless specifically indicated above     Objective:    BP 133/86   Pulse (!) 59   Temp 97.7 F (36.5 C) (Oral)   Wt 140 lb (63.5 kg)   LMP  (LMP Unknown)   SpO2 98%   BMI 26.02 kg/m   Wt Readings from Last 3 Encounters:  12/10/22 140 lb (63.5 kg)  09/03/22 142 lb 12.8 oz (64.8 kg)  08/23/22 140 lb 4.8 oz (63.6 kg)    Physical Exam Vitals and nursing note  reviewed.  Constitutional:      General: She is not in acute distress.    Appearance: Normal appearance. She is not ill-appearing, toxic-appearing or diaphoretic.  HENT:     Head: Normocephalic and atraumatic.     Right Ear: External ear normal.     Left Ear: External ear normal.     Nose: Nose normal.     Mouth/Throat:     Mouth: Mucous membranes are moist.     Pharynx: Oropharynx is clear.  Eyes:     General: No scleral icterus.       Right eye: No discharge.        Left eye: No discharge.     Extraocular Movements: Extraocular movements intact.     Conjunctiva/sclera: Conjunctivae normal.     Pupils: Pupils are equal, round, and reactive to light.  Cardiovascular:     Rate and Rhythm: Normal rate and regular rhythm.     Pulses: Normal pulses.     Heart sounds: Normal heart sounds. No murmur heard.    No friction rub. No gallop.  Pulmonary:     Effort: Pulmonary effort is normal. No respiratory distress.     Breath sounds: Normal breath sounds. No stridor. No wheezing, rhonchi or rales.  Chest:     Chest wall: No tenderness.  Musculoskeletal:        General: Normal range of motion.  Cervical back: Normal range of motion and neck supple.     Comments: Paralumbar spasms L>R  Skin:    General: Skin is warm and dry.     Capillary Refill: Capillary refill takes less than 2 seconds.     Coloration: Skin is not jaundiced or pale.     Findings: No bruising, erythema, lesion or rash.  Neurological:     General: No focal deficit present.     Mental Status: She is alert and oriented to person, place, and time. Mental status is at baseline.  Psychiatric:        Mood and Affect: Mood normal.        Behavior: Behavior normal.        Thought Content: Thought content normal.        Judgment: Judgment normal.     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 Will treat with pepcid x2 weeks and change to meloxicam. Recheck 6 weeks. Call with any concerns.       Relevant Medications   meloxicam (MOBIC) 15 MG tablet   cyclobenzaprine (FLEXERIL) 10 MG tablet     Follow up plan: Return in about 6 weeks (around 01/21/2023).

## 2022-12-10 NOTE — Assessment & Plan Note (Signed)
Naproxen causing GI upset. Will treat with pepcid x2 weeks and change to meloxicam. Recheck 6 weeks. Call with any concerns.

## 2023-01-03 ENCOUNTER — Other Ambulatory Visit: Payer: BC Managed Care – PPO

## 2023-01-03 DIAGNOSIS — E039 Hypothyroidism, unspecified: Secondary | ICD-10-CM | POA: Diagnosis not present

## 2023-01-04 LAB — TSH: TSH: 2.92 u[IU]/mL (ref 0.450–4.500)

## 2023-01-05 ENCOUNTER — Other Ambulatory Visit: Payer: Self-pay | Admitting: Family Medicine

## 2023-01-05 MED ORDER — LEVOTHYROXINE SODIUM 75 MCG PO TABS: 75.0000 ug | ORAL_TABLET | Freq: Every day | ORAL | 3 refills | Status: DC

## 2023-01-17 DIAGNOSIS — Z1211 Encounter for screening for malignant neoplasm of colon: Secondary | ICD-10-CM | POA: Diagnosis not present

## 2023-01-21 ENCOUNTER — Encounter: Payer: Self-pay | Admitting: Family Medicine

## 2023-01-21 ENCOUNTER — Telehealth (INDEPENDENT_AMBULATORY_CARE_PROVIDER_SITE_OTHER): Payer: BC Managed Care – PPO | Admitting: Family Medicine

## 2023-01-21 DIAGNOSIS — M47816 Spondylosis without myelopathy or radiculopathy, lumbar region: Secondary | ICD-10-CM

## 2023-01-21 DIAGNOSIS — M47896 Other spondylosis, lumbar region: Secondary | ICD-10-CM | POA: Diagnosis not present

## 2023-01-21 DIAGNOSIS — G4700 Insomnia, unspecified: Secondary | ICD-10-CM | POA: Diagnosis not present

## 2023-01-21 MED ORDER — ESZOPICLONE 1 MG PO TABS: 1.0000 mg | ORAL_TABLET | Freq: Every evening | ORAL | 5 refills | Status: DC | PRN

## 2023-01-21 MED ORDER — MELOXICAM 15 MG PO TABS: 15.0000 mg | ORAL_TABLET | Freq: Every day | ORAL | 1 refills | Status: DC

## 2023-01-21 NOTE — Assessment & Plan Note (Signed)
Stable on meloxicam. Doesn't want to change anything right now. Refill given. Call with any concerns.

## 2023-01-21 NOTE — Assessment & Plan Note (Signed)
Under good control on current regimen. Continue current regimen. Continue to monitor. Call with any concerns. Refills given.   

## 2023-01-21 NOTE — Progress Notes (Signed)
LMP  (LMP Unknown)    Subjective:    Patient ID: Vicki Schultz, female    DOB: 02/24/1971, 51 y.o.   MRN: [id number]  HPI: Vicki Schultz is a 52 y.o. female  Chief Complaint  Patient presents with   Back Pain    Pt states things are going fine    BACK PAIN Duration: years, better with the meloxicam- back to where she was on the naproxen Mechanism of injury: unknown Location: bilateral and low back Onset: sudden Severity: mild Quality: aching Frequency: intermittent Radiation: none Aggravating factors: nothing Alleviating factors: meloxicam Status: better Treatments attempted:  meloxicam, rest, ice, heat, APAP, ibuprofen, aleve, and HEP  Relief with NSAIDs?: significant Nighttime pain:  no Paresthesias / decreased sensation:  no Bowel / bladder incontinence:  no Fevers:  no Dysuria / urinary frequency:  no  INSOMNIA Duration: chronic Satisfied with sleep quality: yes Difficulty falling asleep: yes Difficulty staying asleep: no Waking a few hours after sleep onset: no Early morning awakenings: no Daytime hypersomnolence: no Wakes feeling refreshed: no Good sleep hygiene: no Apnea: no Snoring: no Depressed/anxious mood: no Recent stress: no Restless legs/nocturnal leg cramps: no Chronic pain/arthritis: no History of sleep study: no Treatments attempted:  lunesta, melatonin, uinsom, and benadryl   Relevant past medical, surgical, family and social history reviewed and updated as indicated. Interim medical history since our last visit reviewed. Allergies and medications reviewed and updated.  Review of Systems  Constitutional: Negative.   Respiratory: Negative.    Cardiovascular: Negative.   Gastrointestinal: Negative.   Musculoskeletal:  Positive for back pain and myalgias. Negative for arthralgias, gait problem, joint swelling, neck pain and neck stiffness.  Psychiatric/Behavioral: Negative.      Per HPI unless specifically indicated above      Objective:    LMP  (LMP Unknown)   Wt Readings from Last 3 Encounters:  12/10/22 140 lb (63.5 kg)  09/03/22 142 lb 12.8 oz (64.8 kg)  08/23/22 140 lb 4.8 oz (63.6 kg)    Physical Exam Vitals and nursing note reviewed.  Constitutional:      General: She is not in acute distress.    Appearance: Normal appearance. She is not ill-appearing, toxic-appearing or diaphoretic.  HENT:     Head: Normocephalic and atraumatic.     Right Ear: External ear normal.     Left Ear: External ear normal.     Nose: Nose normal.     Mouth/Throat:     Mouth: Mucous membranes are moist.     Pharynx: Oropharynx is clear.  Eyes:     General: No scleral icterus.       Right eye: No discharge.        Left eye: No discharge.     Conjunctiva/sclera: Conjunctivae normal.     Pupils: Pupils are equal, round, and reactive to light.  Pulmonary:     Effort: Pulmonary effort is normal. No respiratory distress.     Comments: Speaking in full sentences Musculoskeletal:        General: Normal range of motion.     Cervical back: Normal range of motion.  Skin:    Coloration: Skin is not jaundiced or pale.     Findings: No bruising, erythema, lesion or rash.  Neurological:     Mental Status: She is alert and oriented to person, place, and time. Mental status is at baseline.  Psychiatric:        Mood and Affect: Mood normal.  Behavior: Behavior normal.        Thought Content: Thought content normal.        Judgment: Judgment normal.     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 Doesn't want to change anything right now. Refill given. Call with any concerns.       Relevant Medications   meloxicam (MOBIC) 15 MG tablet      Other   Insomnia    Under good control on current regimen. Continue current regimen. Continue to monitor. Call with any concerns. Refills given.          Follow up plan: Return april Physical.     This visit was completed via video visit through MyChart due to the restrictions of the COVID-19 pandemic. All issues as above were discussed and addressed. Physical exam was done as above through visual confirmation on video through MyChart. If it was felt that the patient should be evaluated in the office, they were directed there. The patient verbally consented to this visit. Location of the patient: work Location of the provider: work Those involved with this call:  Provider: Olevia Perches, DO CMA:  Maggie Font, CMA Front Desk/Registration: Servando Snare  Time spent on call:  15 minutes with patient face to face via video conference. More than 50% of this time was spent in counseling and coordination of care. 23 minutes total spent in review of patient's record and preparation of their chart.

## 2023-01-22 NOTE — Progress Notes (Signed)
Appointment has been made

## 2023-01-23 ENCOUNTER — Ambulatory Visit: Payer: Self-pay

## 2023-03-07 ENCOUNTER — Ambulatory Visit: Payer: BC Managed Care – PPO | Admitting: Family Medicine

## 2023-04-03 ENCOUNTER — Ambulatory Visit
Admission: RE | Admit: 2023-04-03 | Discharge: 2023-04-03 | Disposition: A | Discharge status: Home / Self Care | Payer: BC Managed Care – PPO | Attending: Family Medicine | Admitting: Family Medicine

## 2023-04-03 ENCOUNTER — Ambulatory Visit
Admission: RE | Admit: 2023-04-03 | Discharge: 2023-04-03 | Disposition: A | Discharge status: Home / Self Care | Payer: BC Managed Care – PPO | Source: Ambulatory Visit | Attending: Family Medicine | Admitting: Family Medicine

## 2023-04-03 ENCOUNTER — Encounter: Payer: Self-pay | Admitting: Family Medicine

## 2023-04-03 ENCOUNTER — Ambulatory Visit (INDEPENDENT_AMBULATORY_CARE_PROVIDER_SITE_OTHER): Payer: BC Managed Care – PPO | Admitting: Family Medicine

## 2023-04-03 VITALS — BP 134/76 | HR 70 | Wt 151.0 lb

## 2023-04-03 DIAGNOSIS — M549 Dorsalgia, unspecified: Secondary | ICD-10-CM | POA: Diagnosis not present

## 2023-04-03 DIAGNOSIS — M5416 Radiculopathy, lumbar region: Secondary | ICD-10-CM

## 2023-04-03 DIAGNOSIS — Z23 Encounter for immunization: Secondary | ICD-10-CM | POA: Diagnosis not present

## 2023-04-03 DIAGNOSIS — M48061 Spinal stenosis, lumbar region without neurogenic claudication: Secondary | ICD-10-CM | POA: Diagnosis not present

## 2023-04-03 DIAGNOSIS — M4726 Other spondylosis with radiculopathy, lumbar region: Secondary | ICD-10-CM | POA: Diagnosis not present

## 2023-04-03 DIAGNOSIS — M5116 Intervertebral disc disorders with radiculopathy, lumbar region: Secondary | ICD-10-CM | POA: Diagnosis not present

## 2023-04-03 MED ORDER — GABAPENTIN 100 MG PO CAPS: 100.0000 mg | ORAL_CAPSULE | Freq: Every day | ORAL | 3 refills | Status: DC

## 2023-04-03 NOTE — Progress Notes (Signed)
BP 134/76   Pulse 70   Wt 151 lb (68.5 kg)   LMP  (LMP Unknown)   SpO2 96%   BMI 28.07 kg/m    Subjective:    Patient ID: Vicki Schultz, female    DOB: [birth year], 52 y.o.   MRN: [id number]  HPI: Vicki Schultz is a 52 y.o. female  Chief Complaint  Patient presents with   Leg Pain   BACK PAIN Duration: a little over a month Mechanism of injury: no trauma Location: R lateral thigh Onset: sudden Severity: moderate Quality: mainly numb, but occasionally a sharp pain Frequency: constant Radiation: down R leg to her knee Aggravating factors: nothing Alleviating factors: nothing Status: better- but still definitely there Treatments attempted: nothing  Relief with NSAIDs?: mild Nighttime pain:  yes Paresthesias / decreased sensation:  yes Bowel / bladder incontinence:  no Fevers:  no Dysuria / urinary frequency:  no  Relevant past medical, surgical, family and social history reviewed and updated as indicated. Interim medical history since our last visit reviewed. Allergies and medications reviewed and updated.  Review of Systems  Constitutional: Negative.   Respiratory: Negative.    Cardiovascular: Negative.   Musculoskeletal:  Positive for back pain and myalgias. Negative for arthralgias, gait problem, joint swelling, neck pain and neck stiffness.  Skin: Negative.   Psychiatric/Behavioral: Negative.      Per HPI unless specifically indicated above     Objective:    BP 134/76   Pulse 70   Wt 151 lb (68.5 kg)   LMP  (LMP Unknown)   SpO2 96%   BMI 28.07 kg/m   Wt Readings from Last 3 Encounters:  04/03/23 151 lb (68.5 kg)  12/10/22 140 lb (63.5 kg)  09/03/22 142 lb 12.8 oz (64.8 kg)    Physical Exam Vitals and nursing note reviewed.  Constitutional:      General: She is not in acute distress.    Appearance: Normal appearance. She is not ill-appearing, toxic-appearing or diaphoretic.  HENT:     Head: Normocephalic and atraumatic.     Right Ear:  External ear normal.     Left Ear: External ear normal.     Nose: Nose normal.     Mouth/Throat:     Mouth: Mucous membranes are moist.     Pharynx: Oropharynx is clear.  Eyes:     General: No scleral icterus.       Right eye: No discharge.        Left eye: No discharge.     Extraocular Movements: Extraocular movements intact.     Conjunctiva/sclera: Conjunctivae normal.     Pupils: Pupils are equal, round, and reactive to light.  Cardiovascular:     Rate and Rhythm: Normal rate and regular rhythm.     Pulses: Normal pulses.     Heart sounds: Normal heart sounds. No murmur heard.    No friction rub. No gallop.  Pulmonary:     Effort: Pulmonary effort is normal. No respiratory distress.     Breath sounds: Normal breath sounds. No stridor. No wheezing, rhonchi or rales.  Chest:     Chest wall: No tenderness.  Musculoskeletal:        General: Normal range of motion.     Cervical back: Normal range of motion and neck supple.  Skin:    General: Skin is warm and dry.     Capillary Refill: Capillary refill takes less than 2 seconds.     Coloration: Skin  is not jaundiced or pale.     Findings: No bruising, erythema, lesion or rash.  Neurological:     General: No focal deficit present.     Mental Status: She is alert and oriented to person, place, and time. Mental status is at baseline.  Psychiatric:        Mood and Affect: Mood normal.        Behavior: Behavior normal.        Thought Content: Thought content normal.        Judgment: Judgment normal.     Results for orders placed or performed in visit on 01/03/23  TSH  Result Value Ref Range   TSH 2.920 0.450 - 4.500 uIU/mL      Assessment & Plan:   Problem List Items Addressed This Visit   None Visit Diagnoses     Lumbar radiculopathy    -  Primary   Will start her on gabapentin and get her into PT. Recheck x-ray follow up 1 month. Call with any concerns.   Relevant Medications   gabapentin (NEURONTIN) 100 MG capsule    Other Relevant Orders   Ambulatory referral to Physical Therapy   DG Lumbar Spine Complete   Needs flu shot       Relevant Orders   Flu vaccine trivalent PF, 6mos and older(Flulaval,Afluria,Fluarix,Fluzone)        Follow up plan: Return in about 4 weeks (around 05/01/2023).

## 2023-04-09 ENCOUNTER — Other Ambulatory Visit: Payer: Self-pay | Admitting: Family Medicine

## 2023-04-09 NOTE — Telephone Encounter (Signed)
Requested Prescriptions  Pending Prescriptions Disp Refills   famotidine (PEPCID) 40 MG tablet [Pharmacy Med Name: FAMOTIDINE 40MG  TABLETS] 30 tablet 2    Sig: TAKE 1 TABLET(40 MG) BY MOUTH DAILY     Gastroenterology:  [facility] Passed - 04/09/2023  3:38 AM      Passed - Valid encounter within last 12 months    Recent Outpatient Visits           6 days ago Lumbar radiculopathy   [place] [facility] [place], Megan P, DO   2 months ago Osteoarthritis of lumbar spine, unspecified spinal osteoarthritis complication status   [place] [facility] [place], Megan P, DO   4 months ago Osteoarthritis of lumbar spine, unspecified spinal osteoarthritis complication status   [place] [facility] [place], Megan P, DO   7 months ago Neoplasm of uncertain behavior of skin   [place] [facility] [place], Megan P, DO   7 months ago Routine general medical examination at a health care facility   [facility] [place] [facility] Dorcas Carrow, DO       Future Appointments             In 3 weeks Dorcas Carrow, DO [place] [facility]   In 4 months [place], Oralia Rud, DO [place] [facility]

## 2023-04-18 ENCOUNTER — Ambulatory Visit: Payer: BC Managed Care – PPO | Admitting: Family Medicine

## 2023-04-22 DIAGNOSIS — M47816 Spondylosis without myelopathy or radiculopathy, lumbar region: Secondary | ICD-10-CM | POA: Diagnosis not present

## 2023-04-25 DIAGNOSIS — M47816 Spondylosis without myelopathy or radiculopathy, lumbar region: Secondary | ICD-10-CM | POA: Diagnosis not present

## 2023-05-01 ENCOUNTER — Encounter: Payer: Self-pay | Admitting: Family Medicine

## 2023-05-01 ENCOUNTER — Ambulatory Visit: Payer: BC Managed Care – PPO | Admitting: Family Medicine

## 2023-05-01 VITALS — BP 115/79 | HR 81 | Wt 148.4 lb

## 2023-05-01 DIAGNOSIS — M4726 Other spondylosis with radiculopathy, lumbar region: Secondary | ICD-10-CM | POA: Diagnosis not present

## 2023-05-01 DIAGNOSIS — M47816 Spondylosis without myelopathy or radiculopathy, lumbar region: Secondary | ICD-10-CM | POA: Diagnosis not present

## 2023-05-01 MED ORDER — CYCLOBENZAPRINE HCL 10 MG PO TABS: 10.0000 mg | ORAL_TABLET | Freq: Every day | ORAL | 2 refills | Status: DC

## 2023-05-01 MED ORDER — MELOXICAM 15 MG PO TABS: 15.0000 mg | ORAL_TABLET | Freq: Every day | ORAL | 1 refills | Status: DC

## 2023-05-01 MED ORDER — GABAPENTIN 300 MG PO CAPS: 300.0000 mg | ORAL_CAPSULE | Freq: Every day | ORAL | 2 refills | Status: DC

## 2023-05-01 NOTE — Progress Notes (Signed)
BP 115/79   Pulse 81   Wt 148 lb 6.4 oz (67.3 kg)   LMP  (LMP Unknown)   SpO2 98%   BMI 27.59 kg/m    Subjective:    Patient ID: Vicki Schultz, female    DOB: 1970/12/26, 52 y.o.   MRN: [id number]  HPI: Vicki Schultz is a 52 y.o. female  Chief Complaint  Patient presents with   Back Pain    Patient says she is still about the same from her last visit. Patient is requesting a refill on her Meloxicam prescription at today's visit.    BACK PAIN Duration: about 2 months Mechanism of injury: no trauma Location: R lateral thigh Onset: sudden Severity: moderate Quality: mainly numb, but occasionally sharp Frequency: constant Radiation: down R leg to her knee Aggravating factors: nothing Alleviating factors: nothing Status: stable Treatments attempted: meloxicam, just started PT, gabapentin (no benefit)  Relief with NSAIDs?: mild Nighttime pain:  yes Paresthesias / decreased sensation:  no Bowel / bladder incontinence:  no Fevers:  no Dysuria / urinary frequency:  no  Relevant past medical, surgical, family and social history reviewed and updated as indicated. Interim medical history since our last visit reviewed. Allergies and medications reviewed and updated.  Review of Systems  Constitutional: Negative.   Respiratory: Negative.    Cardiovascular: Negative.   Gastrointestinal: Negative.   Musculoskeletal:  Positive for back pain and myalgias. Negative for arthralgias, gait problem, joint swelling, neck pain and neck stiffness.  Skin: Negative.   Neurological:  Positive for numbness. Negative for dizziness, tremors, seizures, syncope, facial asymmetry, speech difficulty, weakness, light-headedness and headaches.  Psychiatric/Behavioral: Negative.      Per HPI unless specifically indicated above     Objective:    BP 115/79   Pulse 81   Wt 148 lb 6.4 oz (67.3 kg)   LMP  (LMP Unknown)   SpO2 98%   BMI 27.59 kg/m   Wt Readings from Last 3 Encounters:   05/01/23 148 lb 6.4 oz (67.3 kg)  04/03/23 151 lb (68.5 kg)  12/10/22 140 lb (63.5 kg)    Physical Exam Vitals and nursing note reviewed.  Constitutional:      General: She is not in acute distress.    Appearance: Normal appearance. She is not ill-appearing, toxic-appearing or diaphoretic.  HENT:     Head: Normocephalic and atraumatic.     Right Ear: External ear normal.     Left Ear: External ear normal.     Nose: Nose normal.     Mouth/Throat:     Mouth: Mucous membranes are moist.     Pharynx: Oropharynx is clear.  Eyes:     General: No scleral icterus.       Right eye: No discharge.        Left eye: No discharge.     Extraocular Movements: Extraocular movements intact.     Conjunctiva/sclera: Conjunctivae normal.     Pupils: Pupils are equal, round, and reactive to light.  Cardiovascular:     Rate and Rhythm: Normal rate and regular rhythm.     Pulses: Normal pulses.     Heart sounds: Normal heart sounds. No murmur heard.    No friction rub. No gallop.  Pulmonary:     Effort: Pulmonary effort is normal. No respiratory distress.     Breath sounds: Normal breath sounds. No stridor. No wheezing, rhonchi or rales.  Chest:     Chest wall: No tenderness.  Musculoskeletal:  General: Normal range of motion.     Cervical back: Normal range of motion and neck supple.  Skin:    General: Skin is warm and dry.     Capillary Refill: Capillary refill takes less than 2 seconds.     Coloration: Skin is not jaundiced or pale.     Findings: No bruising, erythema, lesion or rash.  Neurological:     General: No focal deficit present.     Mental Status: She is alert and oriented to person, place, and time. Mental status is at baseline.  Psychiatric:        Mood and Affect: Mood normal.        Behavior: Behavior normal.        Thought Content: Thought content normal.        Judgment: Judgment normal.     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 No benefit from the gabapentin. Will increase to 300mg  at bedtime and recheck 1 month. Continue PT. Likely will need MRI.      Relevant Medications   cyclobenzaprine (FLEXERIL) 10 MG tablet   meloxicam (MOBIC) 15 MG tablet     Follow up plan: Return in about 4 weeks (around 05/29/2023).

## 2023-05-01 NOTE — Assessment & Plan Note (Signed)
Not significantly better. No benefit from the gabapentin. Will increase to 300mg  at bedtime and recheck 1 month. Continue PT. Likely will need MRI.

## 2023-05-02 ENCOUNTER — Encounter: Payer: Self-pay | Admitting: Family Medicine

## 2023-05-02 MED ORDER — GABAPENTIN 100 MG PO CAPS
200.0000 mg | ORAL_CAPSULE | Freq: Every day | ORAL | 3 refills | Status: DC
Start: 2023-05-02 — End: 2023-05-30

## 2023-05-08 DIAGNOSIS — M47816 Spondylosis without myelopathy or radiculopathy, lumbar region: Secondary | ICD-10-CM | POA: Diagnosis not present

## 2023-05-15 DIAGNOSIS — M47816 Spondylosis without myelopathy or radiculopathy, lumbar region: Secondary | ICD-10-CM | POA: Diagnosis not present

## 2023-05-21 DIAGNOSIS — M47816 Spondylosis without myelopathy or radiculopathy, lumbar region: Secondary | ICD-10-CM | POA: Diagnosis not present

## 2023-05-27 DIAGNOSIS — F17200 Nicotine dependence, unspecified, uncomplicated: Secondary | ICD-10-CM | POA: Diagnosis not present

## 2023-05-27 DIAGNOSIS — J069 Acute upper respiratory infection, unspecified: Secondary | ICD-10-CM | POA: Diagnosis not present

## 2023-05-30 ENCOUNTER — Ambulatory Visit: Payer: BC Managed Care – PPO | Admitting: Family Medicine

## 2023-05-30 ENCOUNTER — Encounter: Payer: Self-pay | Admitting: Family Medicine

## 2023-05-30 VITALS — BP 111/76 | HR 71 | Temp 98.1°F | Wt 144.2 lb

## 2023-05-30 DIAGNOSIS — M4726 Other spondylosis with radiculopathy, lumbar region: Secondary | ICD-10-CM

## 2023-05-30 MED ORDER — GABAPENTIN 100 MG PO CAPS: 200.0000 mg | ORAL_CAPSULE | Freq: Every day | ORAL | 1 refills | Status: DC

## 2023-05-30 NOTE — Assessment & Plan Note (Signed)
Doing great! Stable on medicine. Continue to monitor. Call with any concerns.

## 2023-05-30 NOTE — Progress Notes (Signed)
BP 111/76   Pulse 71   Temp 98.1 F (36.7 C) (Oral)   Wt 144 lb 3.2 oz (65.4 kg)   LMP  (LMP Unknown)   SpO2 100%   BMI 26.81 kg/m    Subjective:    Patient ID: Vicki Schultz, female    DOB: [birth year], 53 y.o.   MRN: [id number]  HPI: Vicki Schultz is a 53 y.o. female  Chief Complaint  Patient presents with   Back Pain   BACK PAIN Duration: months Mechanism of injury: unknown Location: R lateral thigh Onset: sudden Severity:  resolved Quality: mainly numb, occasionally sharp Frequency: resolved Radiation: R leg to knee Aggravating factors: nothing Alleviating factors: gabapentin Status: better Treatments attempted:  gabapentin, rest, ice, heat, APAP, ibuprofen, aleve, physical therapy, and HEP  Relief with NSAIDs?: mild Nighttime pain:  no Paresthesias / decreased sensation:  no Bowel / bladder incontinence:  no Fevers:  no Dysuria / urinary frequency:  no  Relevant past medical, surgical, family and social history reviewed and updated as indicated. Interim medical history since our last visit reviewed. Allergies and medications reviewed and updated.  Review of Systems  Constitutional: Negative.   Respiratory: Negative.    Cardiovascular: Negative.   Gastrointestinal: Negative.   Musculoskeletal:  Positive for back pain and myalgias. Negative for arthralgias, gait problem, joint swelling, neck pain and neck stiffness.  Psychiatric/Behavioral: Negative.      Per HPI unless specifically indicated above     Objective:    BP 111/76   Pulse 71   Temp 98.1 F (36.7 C) (Oral)   Wt 144 lb 3.2 oz (65.4 kg)   LMP  (LMP Unknown)   SpO2 100%   BMI 26.81 kg/m   Wt Readings from Last 3 Encounters:  05/30/23 144 lb 3.2 oz (65.4 kg)  05/01/23 148 lb 6.4 oz (67.3 kg)  04/03/23 151 lb (68.5 kg)    Physical Exam Vitals and nursing note reviewed.  Constitutional:      General: She is not in acute distress.    Appearance: Normal appearance. She is not  ill-appearing, toxic-appearing or diaphoretic.  HENT:     Head: Normocephalic and atraumatic.     Right Ear: External ear normal.     Left Ear: External ear normal.     Nose: Nose normal.     Mouth/Throat:     Mouth: Mucous membranes are moist.     Pharynx: Oropharynx is clear.  Eyes:     General: No scleral icterus.       Right eye: No discharge.        Left eye: No discharge.     Extraocular Movements: Extraocular movements intact.     Conjunctiva/sclera: Conjunctivae normal.     Pupils: Pupils are equal, round, and reactive to light.  Cardiovascular:     Rate and Rhythm: Normal rate and regular rhythm.     Pulses: Normal pulses.     Heart sounds: Normal heart sounds. No murmur heard.    No friction rub. No gallop.  Pulmonary:     Effort: Pulmonary effort is normal. No respiratory distress.     Breath sounds: Normal breath sounds. No stridor. No wheezing, rhonchi or rales.  Chest:     Chest wall: No tenderness.  Musculoskeletal:        General: Normal range of motion.     Cervical back: Normal range of motion and neck supple.  Skin:    General: Skin is warm and  dry.     Capillary Refill: Capillary refill takes less than 2 seconds.     Coloration: Skin is not jaundiced or pale.     Findings: No bruising, erythema, lesion or rash.  Neurological:     General: No focal deficit present.     Mental Status: She is alert and oriented to person, place, and time. Mental status is at baseline.  Psychiatric:        Mood and Affect: Mood normal.        Behavior: Behavior normal.        Thought Content: Thought content normal.        Judgment: Judgment normal.     Results for orders placed or performed in visit on 01/03/23  TSH   Collection Time: 01/03/23  4:07 PM  Result Value Ref Range   TSH 2.920 0.450 - 4.500 uIU/mL      Assessment & Plan:   Problem List Items Addressed This Visit       Musculoskeletal and Integument   Degenerative joint disease (DJD) of lumbar spine  - Primary   Doing great! Stable on medicine. Continue to monitor. Call with any concerns.         Follow up plan: Return in about 3 months (around 08/28/2023) for physical.

## 2023-06-03 ENCOUNTER — Telehealth: Payer: Self-pay | Admitting: Family Medicine

## 2023-06-03 NOTE — Telephone Encounter (Signed)
Copied from CRM [id number]. Topic: Appointment Scheduling - Scheduling Inquiry for Clinic >> May 30, 2023  2:48 PM Ja-Kwan M wrote: Reason for CRM: Pt reports that she is stuck in traffic about 1/2 away. Pt informed of late policy

## 2023-06-11 DIAGNOSIS — M47816 Spondylosis without myelopathy or radiculopathy, lumbar region: Secondary | ICD-10-CM | POA: Diagnosis not present

## 2023-06-12 ENCOUNTER — Telehealth: Payer: Self-pay | Admitting: Family Medicine

## 2023-06-12 NOTE — Telephone Encounter (Signed)
Copied from CRM [id number]. Topic: Medical Record Request - Other >> Jun 12, 2023  3:16 PM Turkey B wrote: Reason for CRM: pt called in , requesting med records

## 2023-06-12 NOTE — Telephone Encounter (Signed)
Called patient to explain the process for medical record to be released. Patient expressed understanding.

## 2023-07-21 ENCOUNTER — Encounter: Payer: Self-pay | Admitting: Family Medicine

## 2023-07-21 DIAGNOSIS — M4726 Other spondylosis with radiculopathy, lumbar region: Secondary | ICD-10-CM

## 2023-07-25 ENCOUNTER — Telehealth: Payer: Self-pay

## 2023-07-25 NOTE — Telephone Encounter (Signed)
 Copied from CRM [id number]. Topic: General - Other >> Jul 25, 2023  3:44 PM Tiffany S wrote: Reason for CRM: Patient is scheduled for MRI on the 19th but prior Berkley Harvey is needed  [facility] [phone]

## 2023-07-25 NOTE — Telephone Encounter (Signed)
 Routing to referral team for assistance.

## 2023-07-28 NOTE — Telephone Encounter (Signed)
Please get scheduled.

## 2023-07-29 ENCOUNTER — Ambulatory Visit: Admission: RE | Admit: 2023-07-29 | Source: Ambulatory Visit

## 2023-08-15 ENCOUNTER — Ambulatory Visit
Admission: RE | Admit: 2023-08-15 | Discharge: 2023-08-15 | Disposition: A | Discharge status: Home / Self Care | Source: Ambulatory Visit | Attending: Family Medicine | Admitting: Family Medicine

## 2023-08-15 DIAGNOSIS — M48061 Spinal stenosis, lumbar region without neurogenic claudication: Secondary | ICD-10-CM | POA: Diagnosis not present

## 2023-08-15 DIAGNOSIS — M47816 Spondylosis without myelopathy or radiculopathy, lumbar region: Secondary | ICD-10-CM | POA: Diagnosis not present

## 2023-08-15 DIAGNOSIS — M4726 Other spondylosis with radiculopathy, lumbar region: Secondary | ICD-10-CM

## 2023-08-15 DIAGNOSIS — M5126 Other intervertebral disc displacement, lumbar region: Secondary | ICD-10-CM | POA: Diagnosis not present

## 2023-08-25 ENCOUNTER — Encounter: Payer: Self-pay | Admitting: Family Medicine

## 2023-09-05 ENCOUNTER — Encounter: Payer: Self-pay | Admitting: Family Medicine

## 2023-09-05 ENCOUNTER — Other Ambulatory Visit (HOSPITAL_COMMUNITY)
Admission: RE | Admit: 2023-09-05 | Discharge: 2023-09-05 | Disposition: A | Discharge status: Home / Self Care | Source: Ambulatory Visit | Attending: Family Medicine | Admitting: Family Medicine

## 2023-09-05 ENCOUNTER — Ambulatory Visit (INDEPENDENT_AMBULATORY_CARE_PROVIDER_SITE_OTHER): Payer: Self-pay | Admitting: Family Medicine

## 2023-09-05 VITALS — BP 130/80 | HR 61 | Ht 62.0 in | Wt 145.0 lb

## 2023-09-05 DIAGNOSIS — E039 Hypothyroidism, unspecified: Secondary | ICD-10-CM | POA: Diagnosis not present

## 2023-09-05 DIAGNOSIS — Z1231 Encounter for screening mammogram for malignant neoplasm of breast: Secondary | ICD-10-CM

## 2023-09-05 DIAGNOSIS — Z0000 Encounter for general adult medical examination without abnormal findings: Secondary | ICD-10-CM | POA: Insufficient documentation

## 2023-09-05 DIAGNOSIS — M4726 Other spondylosis with radiculopathy, lumbar region: Secondary | ICD-10-CM

## 2023-09-05 DIAGNOSIS — Z87891 Personal history of nicotine dependence: Secondary | ICD-10-CM | POA: Diagnosis not present

## 2023-09-05 MED ORDER — GABAPENTIN 100 MG PO CAPS: ORAL_CAPSULE | ORAL | 1 refills | Status: DC

## 2023-09-05 MED ORDER — CYCLOBENZAPRINE HCL 10 MG PO TABS: 10.0000 mg | ORAL_TABLET | Freq: Every day | ORAL | 2 refills | Status: AC

## 2023-09-05 MED ORDER — MELOXICAM 15 MG PO TABS: 15.0000 mg | ORAL_TABLET | Freq: Every day | ORAL | 1 refills | Status: DC

## 2023-09-05 MED ORDER — ACYCLOVIR 400 MG PO TABS: 400.0000 mg | ORAL_TABLET | Freq: Three times a day (TID) | ORAL | 4 refills | Status: AC

## 2023-09-05 NOTE — Progress Notes (Signed)
 BP 130/80   Pulse 61   Ht 5\' 2"  (1.575 m)   Wt 145 lb (65.8 kg)   LMP  (LMP Unknown)   SpO2 98%   BMI 26.52 kg/m    Subjective:    Patient ID: Vicki Schultz, female    DOB: December 06, 1970, 53 y.o.   MRN: [id number]  HPI: Vicki Schultz is a 53 y.o. female presenting on 09/05/2023 for comprehensive medical examination. Current medical complaints include:  HYPOTHYROIDISM Thyroid  control status:controlled Satisfied with current treatment? yes Medication side effects: no Medication compliance: excellent compliance Etiology of hypothyroidism:  Recent dose adjustment:no Fatigue: no Cold intolerance: no Heat intolerance: no Weight gain: no Weight loss: no Constipation: no Diarrhea/loose stools: no Palpitations: no Lower extremity edema: no Anxiety/depressed mood: no  HYPERLIPIDEMIA Hyperlipidemia status: excellent compliance Satisfied with current treatment?  yes Side effects:  no Medication compliance: excellent compliance Past cholesterol meds: none Supplements: none Aspirin:  no The 10-year ASCVD risk score (Arnett DK, et al., 2019) is: 3.9%   Values used to calculate the score:     Age: 60 years     Sex: Female     Is Non-Hispanic African American: No     Diabetic: No     Tobacco smoker: No     Systolic Blood Pressure: 130 mmHg     Is BP treated: No     HDL Cholesterol: 33 mg/dL     Total Cholesterol: 246 mg/dL Chest pain:  no Coronary artery disease:  no  Back has not been doing well. Waiting on the MRI results. Seems to be taking more tylenol . Gabapentin  is working.  Menopausal Symptoms: no  Depression Screen done today and results listed below:     09/05/2023    4:17 PM 01/21/2023    3:49 PM 12/10/2022    1:20 PM 08/23/2022    4:28 PM 01/31/2022    3:19 PM  Depression screen PHQ 2/9  Decreased Interest 0 0 0 0 0  Down, Depressed, Hopeless 0 0 0 0 0  PHQ - 2 Score 0 0 0 0 0  Altered sleeping  0 3 0 3  Tired, decreased energy  0 1 0 0  Change in  appetite  0 1 0 1  Feeling bad or failure about yourself   0 0 0 0  Trouble concentrating  0 0 0 1  Moving slowly or fidgety/restless  0 0 0 1  Suicidal thoughts  0 0 0 0  PHQ-9 Score  0 5 0 6  Difficult doing work/chores  Not difficult at all Not difficult at all  Not difficult at all    Past Medical History:  Past Medical History:  Diagnosis Date   Thyroid  disease     Surgical History:  Past Surgical History:  Procedure Laterality Date   CESAREAN SECTION     ROTATOR CUFF REPAIR Right 08/03/14   TUBAL LIGATION      Medications:  Current Outpatient Medications on File Prior to Visit  Medication Sig   EPINEPHrine 0.3 mg/0.3 mL IJ SOAJ injection    hydrocortisone  (ANUSOL -HC) 2.5 % rectal cream Place 1 Application rectally 2 (two) times daily.   levothyroxine  (SYNTHROID ) 75 MCG tablet Take 1 tablet (75 mcg total) by mouth daily.   lidocaine  (LIDODERM ) 5 % Place 1 patch onto the skin daily. Remove & Discard patch within 12 hours or as directed by MD   famotidine  (PEPCID ) 40 MG tablet TAKE 1 TABLET(40 MG) BY MOUTH DAILY (Patient  not taking: Reported on 09/05/2023)   mupirocin  ointment (BACTROBAN ) 2 % Apply 1 Application topically 2 (two) times daily.   No current facility-administered medications on file prior to visit.    Allergies:  Allergies  Allergen Reactions   Flagyl  [Metronidazole ] Anaphylaxis   Codeine     Abdominal pain   Trazodone  And Nefazodone Other (See Comments)    depression    Social History:  Social History   Socioeconomic History   Marital status: Married    Spouse name: Not on file   Number of children: Not on file   Years of education: Not on file   Highest education level: Not on file  Occupational History   Not on file  Tobacco Use   Smoking status: Former    Current packs/day: 0.00    Average packs/day: 0.5 packs/day for 37.0 years (18.5 ttl pk-yrs)    Types: Cigarettes    Start date: 44    Quit date: 2020    Years since quitting: 5.3    Smokeless tobacco: Never   Tobacco comments:    Patient Vapes  Vaping Use   Vaping status: Every Day  Substance and Sexual Activity   Alcohol use: Yes    Alcohol/week: 0.0 standard drinks of alcohol    Comment: two or less per week   Drug use: No   Sexual activity: Yes  Other Topics Concern   Not on file  Social History Narrative   Not on file   Social Drivers of Health   Financial Resource Strain: Low Risk  (09/05/2023)   Overall Financial Resource Strain (CARDIA)    Difficulty of Paying Living Expenses: Not hard at all  Food Insecurity: No Food Insecurity (09/05/2023)   Hunger Vital Sign    Worried About Running Out of Food in the Last Year: Never true    Ran Out of Food in the Last Year: Never true  Transportation Needs: No Transportation Needs (09/05/2023)   PRAPARE - Administrator, Civil Service (Medical): No    Lack of Transportation (Non-Medical): No  Physical Activity: Inactive (09/05/2023)   Exercise Vital Sign    Days of Exercise per Week: 0 days    Minutes of Exercise per Session: 0 min  Stress: No Stress Concern Present (09/05/2023)   [employer] of Occupational Health - Occupational Stress Questionnaire    Feeling of Stress : Not at all  Social Connections: Moderately Isolated (09/05/2023)   Social Connection and Isolation Panel [NHANES]    Frequency of Communication with Friends and Family: More than three times a week    Frequency of Social Gatherings with Friends and Family: Once a week    Attends Religious Services: Never    Database administrator or Organizations: No    Attends Banker Meetings: Never    Marital Status: Married  Catering manager Violence: Not At Risk (09/05/2023)   Humiliation, Afraid, Rape, and Kick questionnaire    Fear of Current or Ex-Partner: No    Emotionally Abused: No    Physically Abused: No    Sexually Abused: No   Social History   Tobacco Use  Smoking Status Former   Current packs/day: 0.00    Average packs/day: 0.5 packs/day for 37.0 years (18.5 ttl pk-yrs)   Types: Cigarettes   Start date: 69   Quit date: 2020   Years since quitting: 5.3  Smokeless Tobacco Never  Tobacco Comments   Patient Vapes   Social History  Substance and Sexual Activity  Alcohol Use Yes   Alcohol/week: 0.0 standard drinks of alcohol   Comment: two or less per week    Family History:  Family History  Problem Relation Age of Onset   Cancer Mother    Hypertension Mother    Mental illness Mother    Thyroid  disease Mother    Breast cancer Mother 38   Diabetes Father    Arthritis Sister    Hypertension Sister     Past medical history, surgical history, medications, allergies, family history and social history reviewed with patient today and changes made to appropriate areas of the chart.   Review of Systems  Constitutional: Negative.   HENT: Negative.    Eyes: Negative.   Respiratory: Negative.    Cardiovascular: Negative.   Gastrointestinal: Negative.   Genitourinary: Negative.   Musculoskeletal:  Positive for back pain and myalgias. Negative for falls, joint pain and neck pain.  Skin: Negative.   Neurological:  Positive for tingling. Negative for dizziness, tremors, sensory change, speech change, focal weakness, seizures, loss of consciousness, weakness and headaches.  Endo/Heme/Allergies:  Negative for environmental allergies and polydipsia. Bruises/bleeds easily.  Psychiatric/Behavioral: Negative.     All other ROS negative except what is listed above and in the HPI.      Objective:    BP 130/80   Pulse 61   Ht 5\' 2"  (1.575 m)   Wt 145 lb (65.8 kg)   LMP  (LMP Unknown)   SpO2 98%   BMI 26.52 kg/m   Wt Readings from Last 3 Encounters:  09/05/23 145 lb (65.8 kg)  05/30/23 144 lb 3.2 oz (65.4 kg)  05/01/23 148 lb 6.4 oz (67.3 kg)    Physical Exam Vitals and nursing note reviewed. Exam conducted with a chaperone present.  Constitutional:      General: She is not in  acute distress.    Appearance: Normal appearance. She is not ill-appearing, toxic-appearing or diaphoretic.  HENT:     Head: Normocephalic and atraumatic.     Right Ear: Tympanic membrane, ear canal and external ear normal. There is no impacted cerumen.     Left Ear: Tympanic membrane, ear canal and external ear normal. There is no impacted cerumen.     Nose: Nose normal. No congestion or rhinorrhea.     Mouth/Throat:     Mouth: Mucous membranes are moist.     Pharynx: Oropharynx is clear. No oropharyngeal exudate or posterior oropharyngeal erythema.  Eyes:     General: No scleral icterus.       Right eye: No discharge.        Left eye: No discharge.     Extraocular Movements: Extraocular movements intact.     Conjunctiva/sclera: Conjunctivae normal.     Pupils: Pupils are equal, round, and reactive to light.  Neck:     Vascular: No carotid bruit.  Cardiovascular:     Rate and Rhythm: Normal rate and regular rhythm.     Pulses: Normal pulses.     Heart sounds: No murmur heard.    No friction rub. No gallop.  Pulmonary:     Effort: Pulmonary effort is normal. No respiratory distress.     Breath sounds: Normal breath sounds. No stridor. No wheezing, rhonchi or rales.  Chest:     Chest wall: No tenderness.  Abdominal:     General: Abdomen is flat. Bowel sounds are normal. There is no distension.     Palpations: Abdomen is soft. There  is no mass.     Tenderness: There is no abdominal tenderness. There is no right CVA tenderness, left CVA tenderness, guarding or rebound.     Hernia: No hernia is present.  Genitourinary:    Labia:        Right: No rash, tenderness, lesion or injury.        Left: No rash, tenderness, lesion or injury.      Vagina: No signs of injury and foreign body. Tenderness present. No vaginal discharge, erythema, bleeding, lesions or prolapsed vaginal walls.     Cervix: Normal.     Uterus: Normal.      Adnexa: Right adnexa normal and left adnexa normal.   Musculoskeletal:        General: No swelling, tenderness, deformity or signs of injury.     Cervical back: Normal range of motion and neck supple. No rigidity. No muscular tenderness.     Right lower leg: No edema.     Left lower leg: No edema.  Lymphadenopathy:     Cervical: No cervical adenopathy.  Skin:    General: Skin is warm and dry.     Capillary Refill: Capillary refill takes less than 2 seconds.     Coloration: Skin is not jaundiced or pale.     Findings: No bruising, erythema, lesion or rash.  Neurological:     General: No focal deficit present.     Mental Status: She is alert and oriented to person, place, and time. Mental status is at baseline.     Cranial Nerves: No cranial nerve deficit.     Sensory: No sensory deficit.     Motor: No weakness.     Coordination: Coordination normal.     Gait: Gait normal.     Deep Tendon Reflexes: Reflexes normal.  Psychiatric:        Mood and Affect: Mood normal.        Behavior: Behavior normal.        Thought Content: Thought content normal.        Judgment: Judgment normal.     Results for orders placed or performed in visit on 09/05/23  CBC with Differential/Platelet   Collection Time: 09/05/23  3:55 PM  Result Value Ref Range   WBC 7.0 3.4 - 10.8 x10E3/uL   RBC 3.83 3.77 - 5.28 x10E6/uL   Hemoglobin 12.8 11.1 - 15.9 g/dL   Hematocrit 56.2 13.0 - 46.6 %   MCV 96 79 - 97 fL   MCH 33.4 (H) 26.6 - 33.0 pg   MCHC 34.9 31.5 - 35.7 g/dL   RDW 86.5 78.4 - 69.6 %   Platelets 229 150 - 450 x10E3/uL   Neutrophils 50 Not Estab. %   Lymphs 38 Not Estab. %   Monocytes 7 Not Estab. %   Eos 4 Not Estab. %   Basos 1 Not Estab. %   Neutrophils Absolute 3.5 1.4 - 7.0 x10E3/uL   Lymphocytes Absolute 2.7 0.7 - 3.1 x10E3/uL   Monocytes Absolute 0.5 0.1 - 0.9 x10E3/uL   EOS (ABSOLUTE) 0.3 0.0 - 0.4 x10E3/uL   Basophils Absolute 0.1 0.0 - 0.2 x10E3/uL   Immature Granulocytes 0 Not Estab. %   Immature Grans (Abs) 0.0 0.0 - 0.1  x10E3/uL  Comprehensive metabolic panel with GFR   Collection Time: 09/05/23  3:55 PM  Result Value Ref Range   Glucose 103 (H) 70 - 99 mg/dL   BUN 15 6 - 24 mg/dL   Creatinine, Ser 2.95 (H)  0.57 - 1.00 mg/dL   eGFR 60 >19 JY/NWG/9.56   BUN/Creatinine Ratio 14 9 - 23   Sodium 136 134 - 144 mmol/L   Potassium 3.9 3.5 - 5.2 mmol/L   Chloride 99 96 - 106 mmol/L   CO2 23 20 - 29 mmol/L   Calcium  9.1 8.7 - 10.2 mg/dL   Total Protein 6.8 6.0 - 8.5 g/dL   Albumin 4.4 3.8 - 4.9 g/dL   Globulin, Total 2.4 1.5 - 4.5 g/dL   Bilirubin Total 0.3 0.0 - 1.2 mg/dL   Alkaline Phosphatase 83 44 - 121 IU/L   AST 25 0 - 40 IU/L   ALT 25 0 - 32 IU/L  Lipid Panel w/o Chol/HDL Ratio   Collection Time: 09/05/23  3:55 PM  Result Value Ref Range   Cholesterol, Total 246 (H) 100 - 199 mg/dL   Triglycerides 213 (HH) 0 - 149 mg/dL   HDL 33 (L) >08 mg/dL   VLDL Cholesterol Cal 105 (H) 5 - 40 mg/dL   LDL Chol Calc (NIH) 657 (H) 0 - 99 mg/dL  TSH   Collection Time: 09/05/23  3:55 PM  Result Value Ref Range   TSH 2.070 0.450 - 4.500 uIU/mL      Assessment & Plan:   Problem List Items Addressed This Visit       Endocrine   Hypothyroidism   Rechecking labs today. Await results. Treat as needed.         Musculoskeletal and Integument   Degenerative joint disease (DJD) of lumbar spine   Awaiting MRI results. Treat as needed.       Other Visit Diagnoses       Routine general medical examination at a health care facility    -  Primary   Vaccines up to date. Screening labs checked today. Pap done. Mammogram ordered. Continue diet and exercise. Call with any concerns.   Relevant Orders   CBC with Differential/Platelet (Completed)   Comprehensive metabolic panel with GFR (Completed)   Lipid Panel w/o Chol/HDL Ratio (Completed)   Cytology - PAP   TSH (Completed)     Encounter for screening mammogram for malignant neoplasm of breast       Mammogram ordered today.   Relevant Orders   MM 3D  SCREENING MAMMOGRAM BILATERAL BREAST     Former smoker       Will refer for low dose CT. Await results.   Relevant Orders   Ambulatory Referral Lung Cancer Screening [facility]        Follow up plan: Return in about 1 year (around 09/04/2024) for physical.   LABORATORY TESTING:  - Pap smear: pap done  IMMUNIZATIONS:   - Tdap: Tetanus vaccination status reviewed: last tetanus booster within 10 years. - Influenza: Postponed to flu season - Pneumovax: Not applicable - Prevnar: Not applicable - COVID: Refused - HPV: Not applicable - Shingrix  vaccine: Up to date  SCREENING: -Mammogram: Ordered today  - Colonoscopy: Up to date   PATIENT COUNSELING:   Advised to take 1 mg of folate supplement per day if capable of pregnancy.   Sexuality: Discussed sexually transmitted diseases, partner selection, use of condoms, avoidance of unintended pregnancy  and contraceptive alternatives.   Advised to avoid cigarette smoking.  I discussed with the patient that most people either abstain from alcohol or drink within safe limits (<=14/week and <=4 drinks/occasion for males, <=7/weeks and <= 3 drinks/occasion for females) and that the risk for alcohol disorders and other health effects  rises proportionally with the number of drinks per week and how often a drinker exceeds daily limits.  Discussed cessation/primary prevention of drug use and availability of treatment for abuse.   Diet: Encouraged to adjust caloric intake to maintain  or achieve ideal body weight, to reduce intake of dietary saturated fat and total fat, to limit sodium intake by avoiding high sodium foods and not adding table salt, and to maintain adequate dietary potassium and calcium  preferably from fresh fruits, vegetables, and low-fat dairy products.    stressed the importance of regular exercise  Injury prevention: Discussed safety belts, safety helmets, smoke detector, smoking near bedding or upholstery.   Dental  health: Discussed importance of regular tooth brushing, flossing, and dental visits.    NEXT PREVENTATIVE PHYSICAL DUE IN 1 YEAR. Return in about 1 year (around 09/04/2024) for physical.

## 2023-09-06 LAB — COMPREHENSIVE METABOLIC PANEL WITH GFR
ALT: 25 IU/L (ref 0–32)
AST: 25 IU/L (ref 0–40)
Albumin: 4.4 g/dL (ref 3.8–4.9)
Alkaline Phosphatase: 83 IU/L (ref 44–121)
BUN/Creatinine Ratio: 14 (ref 9–23)
BUN: 15 mg/dL (ref 6–24)
Bilirubin Total: 0.3 mg/dL (ref 0.0–1.2)
CO2: 23 mmol/L (ref 20–29)
Calcium: 9.1 mg/dL (ref 8.7–10.2)
Chloride: 99 mmol/L (ref 96–106)
Creatinine, Ser: 1.1 mg/dL — ABNORMAL HIGH (ref 0.57–1.00)
Globulin, Total: 2.4 g/dL (ref 1.5–4.5)
Glucose: 103 mg/dL — ABNORMAL HIGH (ref 70–99)
Potassium: 3.9 mmol/L (ref 3.5–5.2)
Sodium: 136 mmol/L (ref 134–144)
Total Protein: 6.8 g/dL (ref 6.0–8.5)
eGFR: 60 mL/min/{1.73_m2} (ref >59)

## 2023-09-06 LAB — CBC WITH DIFFERENTIAL/PLATELET
Basophils Absolute: 0.1 10*3/uL (ref 0.0–0.2)
Basos: 1 %
EOS (ABSOLUTE): 0.3 10*3/uL (ref 0.0–0.4)
Eos: 4 %
Hematocrit: 36.7 % (ref 34.0–46.6)
Hemoglobin: 12.8 g/dL (ref 11.1–15.9)
Immature Grans (Abs): 0 10*3/uL (ref 0.0–0.1)
Immature Granulocytes: 0 %
Lymphocytes Absolute: 2.7 10*3/uL (ref 0.7–3.1)
Lymphs: 38 %
MCH: 33.4 pg — ABNORMAL HIGH (ref 26.6–33.0)
MCHC: 34.9 g/dL (ref 31.5–35.7)
MCV: 96 fL (ref 79–97)
Monocytes Absolute: 0.5 10*3/uL (ref 0.1–0.9)
Monocytes: 7 %
Neutrophils Absolute: 3.5 10*3/uL (ref 1.4–7.0)
Neutrophils: 50 %
Platelets: 229 10*3/uL (ref 150–450)
RBC: 3.83 x10E6/uL (ref 3.77–5.28)
RDW: 13.2 % (ref 11.7–15.4)
WBC: 7 10*3/uL (ref 3.4–10.8)

## 2023-09-06 LAB — LIPID PANEL W/O CHOL/HDL RATIO
Cholesterol, Total: 246 mg/dL — ABNORMAL HIGH (ref 100–199)
HDL: 33 mg/dL — ABNORMAL LOW (ref >39)
LDL Chol Calc (NIH): 108 mg/dL — ABNORMAL HIGH (ref 0–99)
Triglycerides: 605 mg/dL (ref 0–149)
VLDL Cholesterol Cal: 105 mg/dL — ABNORMAL HIGH (ref 5–40)

## 2023-09-06 LAB — TSH: TSH: 2.07 u[IU]/mL (ref 0.450–4.500)

## 2023-09-07 ENCOUNTER — Encounter: Payer: Self-pay | Admitting: Family Medicine

## 2023-09-07 ENCOUNTER — Other Ambulatory Visit: Payer: Self-pay | Admitting: Family Medicine

## 2023-09-07 MED ORDER — LEVOTHYROXINE SODIUM 75 MCG PO TABS: 75.0000 ug | ORAL_TABLET | Freq: Every day | ORAL | 3 refills | Status: AC

## 2023-09-07 NOTE — Assessment & Plan Note (Signed)
 Awaiting MRI results. Treat as needed.

## 2023-09-07 NOTE — Assessment & Plan Note (Signed)
 Rechecking labs today. Await results. Treat as needed.

## 2023-09-09 ENCOUNTER — Encounter: Payer: Self-pay | Admitting: *Deleted

## 2023-09-11 ENCOUNTER — Other Ambulatory Visit: Payer: Self-pay | Admitting: Family Medicine

## 2023-09-11 ENCOUNTER — Encounter: Payer: Self-pay | Admitting: Family Medicine

## 2023-09-11 DIAGNOSIS — M4726 Other spondylosis with radiculopathy, lumbar region: Secondary | ICD-10-CM

## 2023-09-11 LAB — CYTOLOGY - PAP
Comment: NEGATIVE
Diagnosis: NEGATIVE
High risk HPV: NEGATIVE

## 2023-09-23 ENCOUNTER — Telehealth: Payer: Self-pay | Admitting: Acute Care

## 2023-09-23 ENCOUNTER — Other Ambulatory Visit: Payer: Self-pay

## 2023-09-23 DIAGNOSIS — G8929 Other chronic pain: Secondary | ICD-10-CM | POA: Diagnosis not present

## 2023-09-23 DIAGNOSIS — Z122 Encounter for screening for malignant neoplasm of respiratory organs: Secondary | ICD-10-CM

## 2023-09-23 DIAGNOSIS — M5416 Radiculopathy, lumbar region: Secondary | ICD-10-CM | POA: Diagnosis not present

## 2023-09-23 DIAGNOSIS — M5441 Lumbago with sciatica, right side: Secondary | ICD-10-CM | POA: Diagnosis not present

## 2023-09-23 DIAGNOSIS — M5442 Lumbago with sciatica, left side: Secondary | ICD-10-CM | POA: Diagnosis not present

## 2023-09-23 DIAGNOSIS — Z87891 Personal history of nicotine dependence: Secondary | ICD-10-CM

## 2023-09-23 NOTE — Telephone Encounter (Signed)
 Lung Cancer Screening Narrative/Criteria Questionnaire (Cigarette Smokers Only- No Cigars/Pipes/vapes)   Vicki Schultz   SDMV:10/28/23 at 4pm / [facility]                                           July 02, 1970               LDCT: 11/07/23 at 230pm/[facility]    53 y.o.   Phone: [phone]  Lung Screening Narrative (confirm age 77-77 yrs Medicare / 50-80 yrs Private pay insurance)   Insurance information:BCBS   Referring Provider:Johnson   This screening involves an initial phone call with a team member from our program. It is called a shared decision making visit. The initial meeting is required by insurance and Medicare to make sure you understand the program. This appointment takes about 15-20 minutes to complete. The CT scan will completed at a separate date/time. This scan takes about 5-10 minutes to complete and you may eat and drink before and after the scan.  Criteria questions for Lung Cancer Screening:   Are you a current or former smoker? Former Age began smoking: 11y   If you are a former smoker, what year did you quit smoking? 2020   To calculate your smoking history, I need an accurate estimate of how many packs of cigarettes you smoked per day and for how many years. (Not just the number of PPD you are now smoking)   Years smoking 36 x Packs per day 1 = Pack years 36   (at least 20 pack yrs)   (Make sure they understand that we need to know how much they have smoked in the past, not just the number of PPD they are smoking now)  Do you have a personal history of cancer?  No    Do you have a family history of cancer? Yes  (cancer type and and relative) mother/breast  Are you coughing up blood?  No  Have you had unexplained weight loss of 15 lbs or more in the last 6 months? No  It looks like you meet all criteria.     Additional information: N/A

## 2023-10-28 ENCOUNTER — Encounter: Admitting: Acute Care

## 2023-10-29 ENCOUNTER — Ambulatory Visit: Admitting: Acute Care

## 2023-10-29 DIAGNOSIS — Z87891 Personal history of nicotine dependence: Secondary | ICD-10-CM | POA: Diagnosis not present

## 2023-10-29 NOTE — Progress Notes (Signed)
 Virtual Visit via Telephone Note  I connected with Vicki Schultz on 10/29/23 at 11:30 AM EDT by telephone and verified that I am speaking with the correct person using two identifiers.  Location: Patient: Vicki Schultz Provider: Alyse Bach, RN   I discussed the limitations, risks, security and privacy concerns of performing an evaluation and management service by telephone and the availability of in person appointments. I also discussed with the patient that there may be a patient responsible charge related to this service. The patient expressed understanding and agreed to proceed.   Shared Decision Making Visit Lung Cancer Screening Program [phone])   Eligibility: Age 53 y.o. Pack Years Smoking History Calculation 36 (# packs/per year x # years smoked) Recent History of coughing up blood  no Unexplained weight loss? no ( >Than 15 pounds within the last 6 months ) Prior History Lung / other cancer no (Diagnosis within the last 5 years already requiring surveillance chest CT Scans). Smoking Status Former Smoker Former Smokers: Years since quit: 5 years  Quit Date: 2020  Visit Components: Discussion included one or more decision making aids. yes Discussion included risk/benefits of screening. yes Discussion included potential follow up diagnostic testing for abnormal scans. yes Discussion included meaning and risk of over diagnosis. yes Discussion included meaning and risk of False Positives. yes Discussion included meaning of total radiation exposure. yes  Counseling Included: Importance of adherence to annual lung cancer LDCT screening. yes Impact of comorbidities on ability to participate in the program. yes Ability and willingness to under diagnostic treatment. yes  Smoking Cessation Counseling: Current Smokers:  Discussed importance of smoking cessation. yes Information about tobacco cessation classes and interventions provided to patient. yes Patient provided  with ticket for LDCT Scan. no Symptomatic Patient. no  Counseling(Intermediate counseling: > three minutes) 99406 Diagnosis Code: Tobacco Use Z72.0 Asymptomatic Patient yes  Counseling (Intermediate counseling: > three minutes counseling) [id number] Former Smokers:  Discussed the importance of maintaining cigarette abstinence. yes Diagnosis Code: Personal History of Nicotine  Dependence. Y86.578 Information about tobacco cessation classes and interventions provided to patient. Yes Patient provided with ticket for LDCT Scan. no Written Order for Lung Cancer Screening with LDCT placed in Epic. Yes (CT Chest Lung Cancer Screening Low Dose W/O CM) [id number] Z12.2-Screening of respiratory organs Z87.891-Personal history of nicotine  dependence   Alyse Bach, RN

## 2023-10-29 NOTE — Patient Instructions (Signed)

## 2023-11-07 ENCOUNTER — Ambulatory Visit
Admission: RE | Admit: 2023-11-07 | Discharge: 2023-11-07 | Disposition: A | Discharge status: Home / Self Care | Source: Ambulatory Visit | Attending: Family Medicine

## 2023-11-07 DIAGNOSIS — Z122 Encounter for screening for malignant neoplasm of respiratory organs: Secondary | ICD-10-CM | POA: Insufficient documentation

## 2023-11-07 DIAGNOSIS — Z87891 Personal history of nicotine dependence: Secondary | ICD-10-CM | POA: Diagnosis not present

## 2023-11-18 ENCOUNTER — Other Ambulatory Visit: Payer: Self-pay

## 2023-11-18 DIAGNOSIS — Z122 Encounter for screening for malignant neoplasm of respiratory organs: Secondary | ICD-10-CM

## 2023-11-18 DIAGNOSIS — Z87891 Personal history of nicotine dependence: Secondary | ICD-10-CM

## 2023-11-21 DIAGNOSIS — M5416 Radiculopathy, lumbar region: Secondary | ICD-10-CM | POA: Diagnosis not present

## 2023-12-19 ENCOUNTER — Other Ambulatory Visit: Payer: Self-pay | Admitting: Physical Medicine & Rehabilitation

## 2023-12-19 DIAGNOSIS — M5441 Lumbago with sciatica, right side: Secondary | ICD-10-CM | POA: Diagnosis not present

## 2023-12-19 DIAGNOSIS — M5416 Radiculopathy, lumbar region: Secondary | ICD-10-CM | POA: Diagnosis not present

## 2023-12-19 DIAGNOSIS — M542 Cervicalgia: Secondary | ICD-10-CM | POA: Diagnosis not present

## 2023-12-19 DIAGNOSIS — M5442 Lumbago with sciatica, left side: Secondary | ICD-10-CM | POA: Diagnosis not present

## 2023-12-25 ENCOUNTER — Ambulatory Visit
Admission: RE | Admit: 2023-12-25 | Discharge: 2023-12-25 | Disposition: A | Discharge status: Home / Self Care | Source: Ambulatory Visit | Attending: Physical Medicine & Rehabilitation | Admitting: Physical Medicine & Rehabilitation

## 2023-12-25 DIAGNOSIS — M4312 Spondylolisthesis, cervical region: Secondary | ICD-10-CM | POA: Diagnosis not present

## 2023-12-25 DIAGNOSIS — M47812 Spondylosis without myelopathy or radiculopathy, cervical region: Secondary | ICD-10-CM | POA: Diagnosis not present

## 2023-12-25 DIAGNOSIS — M50222 Other cervical disc displacement at C5-C6 level: Secondary | ICD-10-CM | POA: Diagnosis not present

## 2023-12-25 DIAGNOSIS — M542 Cervicalgia: Secondary | ICD-10-CM

## 2024-01-14 DIAGNOSIS — I6381 Other cerebral infarction due to occlusion or stenosis of small artery: Secondary | ICD-10-CM | POA: Diagnosis not present

## 2024-01-14 DIAGNOSIS — M5416 Radiculopathy, lumbar region: Secondary | ICD-10-CM | POA: Diagnosis not present

## 2024-01-14 DIAGNOSIS — M5442 Lumbago with sciatica, left side: Secondary | ICD-10-CM | POA: Diagnosis not present

## 2024-01-14 DIAGNOSIS — M542 Cervicalgia: Secondary | ICD-10-CM | POA: Diagnosis not present

## 2024-01-26 ENCOUNTER — Encounter: Payer: Self-pay | Admitting: Acute Care

## 2024-01-28 NOTE — Progress Notes (Signed)
 Virtual Visit via Telephone Note   I connected with Vicki Schultz on 10/29/23 at 11:30 AM EDT by telephone and verified that I am speaking with the correct person using two identifiers.   Location: Patient: at home Provider: [address] [place], [place], [address]    I discussed the limitations, risks, security and privacy concerns of performing an evaluation and management service by telephone and the availability of in person appointments. I also discussed with the patient that there may be a patient responsible charge related to this service. The patient expressed understanding and agreed to proceed.

## 2024-02-02 DIAGNOSIS — M546 Pain in thoracic spine: Secondary | ICD-10-CM | POA: Diagnosis not present

## 2024-02-02 DIAGNOSIS — M542 Cervicalgia: Secondary | ICD-10-CM | POA: Diagnosis not present

## 2024-02-12 DIAGNOSIS — M546 Pain in thoracic spine: Secondary | ICD-10-CM | POA: Diagnosis not present

## 2024-02-12 DIAGNOSIS — M542 Cervicalgia: Secondary | ICD-10-CM | POA: Diagnosis not present

## 2024-02-16 DIAGNOSIS — M542 Cervicalgia: Secondary | ICD-10-CM | POA: Diagnosis not present

## 2024-02-16 DIAGNOSIS — M546 Pain in thoracic spine: Secondary | ICD-10-CM | POA: Diagnosis not present

## 2024-02-22 ENCOUNTER — Other Ambulatory Visit: Payer: Self-pay | Admitting: Family Medicine

## 2024-02-23 DIAGNOSIS — M542 Cervicalgia: Secondary | ICD-10-CM | POA: Diagnosis not present

## 2024-02-23 DIAGNOSIS — M546 Pain in thoracic spine: Secondary | ICD-10-CM | POA: Diagnosis not present

## 2024-02-24 NOTE — Telephone Encounter (Signed)
 Requested Prescriptions  Pending Prescriptions Disp Refills   gabapentin  (NEURONTIN ) 100 MG capsule [Pharmacy Med Name: GABAPENTIN  100MG  CAPSULES] 270 capsule 1    Sig: TAKE 3 CAPSULES BY MOUTH DAILY     Neurology: Anticonvulsants - gabapentin  Failed - 02/24/2024  3:21 PM      Failed - Cr in normal range and within 360 days    Creatinine  Date Value Ref Range Status  11/05/2011 0.84 0.60 - 1.30 mg/dL Final   Creatinine, Ser  Date Value Ref Range Status  09/05/2023 1.10 (H) 0.57 - 1.00 mg/dL Final         Passed - Completed PHQ-2 or PHQ-9 in the last 360 days      Passed - Valid encounter within last 12 months    Recent Outpatient Visits           5 months ago Routine general medical examination at a health care facility   [facility], Megan P, DO

## 2024-02-26 DIAGNOSIS — M542 Cervicalgia: Secondary | ICD-10-CM | POA: Diagnosis not present

## 2024-02-26 DIAGNOSIS — M546 Pain in thoracic spine: Secondary | ICD-10-CM | POA: Diagnosis not present

## 2024-03-12 DIAGNOSIS — M542 Cervicalgia: Secondary | ICD-10-CM | POA: Diagnosis not present

## 2024-03-12 DIAGNOSIS — M546 Pain in thoracic spine: Secondary | ICD-10-CM | POA: Diagnosis not present

## 2024-03-18 DIAGNOSIS — M542 Cervicalgia: Secondary | ICD-10-CM | POA: Diagnosis not present

## 2024-03-18 DIAGNOSIS — M546 Pain in thoracic spine: Secondary | ICD-10-CM | POA: Diagnosis not present

## 2024-03-22 DIAGNOSIS — M546 Pain in thoracic spine: Secondary | ICD-10-CM | POA: Diagnosis not present

## 2024-03-22 DIAGNOSIS — M542 Cervicalgia: Secondary | ICD-10-CM | POA: Diagnosis not present

## 2024-03-25 DIAGNOSIS — M542 Cervicalgia: Secondary | ICD-10-CM | POA: Diagnosis not present

## 2024-03-25 DIAGNOSIS — M546 Pain in thoracic spine: Secondary | ICD-10-CM | POA: Diagnosis not present

## 2024-03-29 DIAGNOSIS — M546 Pain in thoracic spine: Secondary | ICD-10-CM | POA: Diagnosis not present

## 2024-04-12 DIAGNOSIS — M546 Pain in thoracic spine: Secondary | ICD-10-CM | POA: Diagnosis not present

## 2024-04-12 DIAGNOSIS — M542 Cervicalgia: Secondary | ICD-10-CM | POA: Diagnosis not present

## 2024-04-15 ENCOUNTER — Other Ambulatory Visit: Payer: Self-pay | Admitting: Neurology

## 2024-04-15 DIAGNOSIS — I6381 Other cerebral infarction due to occlusion or stenosis of small artery: Secondary | ICD-10-CM

## 2024-04-22 DIAGNOSIS — M546 Pain in thoracic spine: Secondary | ICD-10-CM | POA: Diagnosis not present

## 2024-04-22 DIAGNOSIS — M542 Cervicalgia: Secondary | ICD-10-CM | POA: Diagnosis not present

## 2024-04-23 ENCOUNTER — Inpatient Hospital Stay: Admission: RE | Admit: 2024-04-23 | Source: Ambulatory Visit

## 2024-04-26 DIAGNOSIS — M5416 Radiculopathy, lumbar region: Secondary | ICD-10-CM | POA: Diagnosis not present

## 2024-04-28 ENCOUNTER — Other Ambulatory Visit: Payer: Self-pay | Admitting: Family Medicine

## 2024-04-30 NOTE — Telephone Encounter (Signed)
 Requested Prescriptions  Pending Prescriptions Disp Refills   meloxicam  (MOBIC ) 15 MG tablet [Pharmacy Med Name: MELOXICAM  15MG  TABLETS] 90 tablet 1    Sig: TAKE 1 TABLET(15 MG) BY MOUTH DAILY     Analgesics:  COX2 Inhibitors Failed - 04/30/2024  3:13 PM      Failed - Manual Review: Labs are only required if the patient has taken medication for more than 8 weeks.      Failed - Cr in normal range and within 360 days    Creatinine  Date Value Ref Range Status  11/05/2011 0.84 0.60 - 1.30 mg/dL Final   Creatinine, Ser  Date Value Ref Range Status  09/05/2023 1.10 (H) 0.57 - 1.00 mg/dL Final         Passed - HGB in normal range and within 360 days    Hemoglobin  Date Value Ref Range Status  09/05/2023 12.8 11.1 - 15.9 g/dL Final         Passed - HCT in normal range and within 360 days    Hematocrit  Date Value Ref Range Status  09/05/2023 36.7 34.0 - 46.6 % Final         Passed - AST in normal range and within 360 days    AST  Date Value Ref Range Status  09/05/2023 25 0 - 40 IU/L Final         Passed - ALT in normal range and within 360 days    ALT  Date Value Ref Range Status  09/05/2023 25 0 - 32 IU/L Final         Passed - eGFR is 30 or above and within 360 days    EGFR (African American)  Date Value Ref Range Status  11/05/2011 >60  Final   GFR calc Af Amer  Date Value Ref Range Status  01/13/2020 84 >59 mL/min/1.73 Final    Comment:    **[facility] currently reports eGFR in compliance with the current**   recommendations of the [employer]. [facility] will   update reporting as new guidelines are published from the NKF-ASN   Task force.    EGFR (Non-African Amer.)  Date Value Ref Range Status  11/05/2011 >60  Final    Comment:    eGFR values <4mL/min/1.73 m2 may be an indication of chronic kidney disease (CKD). Calculated eGFR is useful in patients with stable renal function. The eGFR calculation will not be reliable in acutely ill  patients when serum creatinine is changing rapidly. It is not useful in  patients on dialysis. The eGFR calculation may not be applicable to patients at the low and high extremes of body sizes, pregnant women, and vegetarians.    GFR calc non Af Amer  Date Value Ref Range Status  01/13/2020 73 >59 mL/min/1.73 Final   eGFR  Date Value Ref Range Status  09/05/2023 60 >59 mL/min/1.73 Final         Passed - Patient is not pregnant      Passed - Valid encounter within last 12 months    Recent Outpatient Visits           7 months ago Routine general medical examination at a health care facility   [facility], Megan P, DO

## 2024-05-10 ENCOUNTER — Ambulatory Visit
Admission: RE | Admit: 2024-05-10 | Discharge: 2024-05-10 | Disposition: A | Source: Ambulatory Visit | Attending: Neurology | Admitting: Neurology

## 2024-05-10 DIAGNOSIS — I6381 Other cerebral infarction due to occlusion or stenosis of small artery: Secondary | ICD-10-CM

## 2024-05-12 ENCOUNTER — Encounter: Payer: Self-pay | Admitting: Family Medicine

## 2024-05-14 ENCOUNTER — Other Ambulatory Visit: Payer: Self-pay | Admitting: Family Medicine

## 2024-05-14 MED ORDER — MELOXICAM 15 MG PO TABS: 15.0000 mg | ORAL_TABLET | Freq: Every day | ORAL | 1 refills | Status: AC
